# Patient Record
Sex: Female | Born: 1950 | Race: Asian | Hispanic: No | Smoking: Never smoker
Health system: Southern US, Community
[De-identification: ages and names within clinical notes are randomized; demographics above are authoritative.]

## PROBLEM LIST (undated history)

## (undated) DIAGNOSIS — I10 Essential (primary) hypertension: Secondary | ICD-10-CM

## (undated) DIAGNOSIS — M1990 Unspecified osteoarthritis, unspecified site: Secondary | ICD-10-CM

## (undated) HISTORY — DX: Essential (primary) hypertension: I10

---

## 2014-06-06 ENCOUNTER — Emergency Department (HOSPITAL_BASED_OUTPATIENT_CLINIC_OR_DEPARTMENT_OTHER)
Admission: EM | Admit: 2014-06-06 | Discharge: 2014-06-06 | Disposition: A | Discharge status: Home / Self Care | Payer: Self-pay | Attending: Emergency Medicine | Admitting: Emergency Medicine

## 2014-06-06 ENCOUNTER — Emergency Department (HOSPITAL_BASED_OUTPATIENT_CLINIC_OR_DEPARTMENT_OTHER): Payer: Self-pay

## 2014-06-06 ENCOUNTER — Encounter (HOSPITAL_BASED_OUTPATIENT_CLINIC_OR_DEPARTMENT_OTHER): Payer: Self-pay | Admitting: *Deleted

## 2014-06-06 DIAGNOSIS — R52 Pain, unspecified: Secondary | ICD-10-CM

## 2014-06-06 DIAGNOSIS — M25461 Effusion, right knee: Secondary | ICD-10-CM | POA: Insufficient documentation

## 2014-06-06 HISTORY — DX: Unspecified osteoarthritis, unspecified site: M19.90

## 2014-06-06 LAB — CBC WITH DIFFERENTIAL/PLATELET
BASOS ABS: 0 10*3/uL (ref 0.0–0.1)
Basophils Relative: 0 % (ref 0–1)
EOS PCT: 1 % (ref 0–5)
Eosinophils Absolute: 0.1 10*3/uL (ref 0.0–0.7)
HEMATOCRIT: 37.6 % (ref 36.0–46.0)
HEMOGLOBIN: 12.6 g/dL (ref 12.0–15.0)
LYMPHS PCT: 15 % (ref 12–46)
Lymphs Abs: 1.1 10*3/uL (ref 0.7–4.0)
MCH: 29.2 pg (ref 26.0–34.0)
MCHC: 33.5 g/dL (ref 30.0–36.0)
MCV: 87.2 fL (ref 78.0–100.0)
MONO ABS: 0.8 10*3/uL (ref 0.1–1.0)
MONOS PCT: 10 % (ref 3–12)
NEUTROS ABS: 5.7 10*3/uL (ref 1.7–7.7)
Neutrophils Relative %: 74 % (ref 43–77)
Platelets: 433 10*3/uL — ABNORMAL HIGH (ref 150–400)
RBC: 4.31 MIL/uL (ref 3.87–5.11)
RDW: 12.9 % (ref 11.5–15.5)
WBC: 7.7 10*3/uL (ref 4.0–10.5)

## 2014-06-06 LAB — BASIC METABOLIC PANEL
ANION GAP: 15 (ref 5–15)
BUN: 16 mg/dL (ref 6–23)
CALCIUM: 9.6 mg/dL (ref 8.4–10.5)
CHLORIDE: 100 meq/L (ref 96–112)
CO2: 25 meq/L (ref 19–32)
CREATININE: 0.6 mg/dL (ref 0.50–1.10)
GFR calc Af Amer: >90 mL/min (ref >90)
GFR calc non Af Amer: >90 mL/min (ref >90)
Glucose, Bld: 130 mg/dL — ABNORMAL HIGH (ref 70–99)
Potassium: 4.1 mEq/L (ref 3.7–5.3)
Sodium: 140 mEq/L (ref 137–147)

## 2014-06-06 MED ORDER — KETOROLAC TROMETHAMINE 60 MG/2ML IM SOLN
30.0000 mg | Freq: Once | INTRAMUSCULAR | Status: AC
  Administered 2014-06-06: 30 mg via INTRAMUSCULAR
  Filled 2014-06-06: qty 2

## 2014-06-06 MED ORDER — HYDROCODONE-ACETAMINOPHEN 5-325 MG PO TABS: 1.0000 | ORAL_TABLET | ORAL | Status: DC | PRN

## 2014-06-06 NOTE — ED Notes (Signed)
Pain in her right x 2 months. Pain in her left knee x 3 days. Daughter states she now needs assistance walking due to pain.

## 2014-06-06 NOTE — ED Provider Notes (Signed)
CSN: [id number]     Arrival date & time 06/06/14  1224 History   First MD Initiated Contact with Patient 06/06/14 1234     Chief Complaint  Patient presents with  . Knee Pain     (Consider location/radiation/quality/duration/timing/severity/associated sxs/prior Treatment) HPI Comments: Pt comes in today with right knee pain times 2 months. 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 Pt states that she has had some generalized muscle aches as well. No fever, redness or warmth to any joints. Pt states that she doesn't have any medical problems and she sees her pcp in [place] two times a year. She states that her left knee has been bothering her intermittently as well  The history is provided by the patient and a relative. No language interpreter was used.    Past Medical History  Diagnosis Date  . Arthritis    History reviewed. No pertinent past surgical history. No family history on file. History  Substance Use Topics  . Smoking status: Never Smoker   . Smokeless tobacco: Not on file  . Alcohol Use: No   OB History    No data available     Review of Systems  All other systems reviewed and are negative.     Allergies  Review of patient's allergies indicates no known allergies.  Home Medications   Prior to Admission medications   Not on File   BP 142/89 mmHg  Pulse 87  Temp(Src) 97.6 F (36.4 C) (Oral)  Resp 18  Ht 5\' 1"  (1.549 m)  Wt 125 lb (56.7 kg)  BMI 23.63 kg/m2  SpO2 97% Physical Exam  Constitutional: She is oriented to person, place, and time. She appears well-developed and well-nourished.  Cardiovascular: Normal rate and regular rhythm.   Pulmonary/Chest: Effort normal and breath sounds normal.  Musculoskeletal:  Mild swelling noted to the right knee. Pt has full rom. No redness or warmth noted to the area. Pt moving all extremities without any problem   Neurological: She is oriented to person, place, and time. She exhibits normal muscle tone. Coordination normal.  Skin: Skin is warm and dry.  Nursing note and vitals reviewed.   ED Course  Procedures (including critical care time) Labs Review Labs Reviewed  CBC WITH DIFFERENTIAL - Abnormal; Notable for the following:    Platelets 433 (*)    All other components within normal limits  BASIC METABOLIC PANEL - Abnormal; Notable for the following:    Glucose, Bld 130 (*)    All other components within normal limits    Imaging Review Dg Knee 1-2 Views Right  06/06/2014   CLINICAL DATA:  Atraumatic anterior right knee pain ; history of intermittent peripatellar soft tissue swelling.  EXAM: RIGHT KNEE - 1-2 VIEW  COMPARISON:  None.  FINDINGS: The bones of the right knee are adequately mineralized. There is no acute fracture nor dislocation. There is minimal beaking of the tibial spines. There is no lytic nor blastic bony lesion.A small suprapatellar a fusion is suspected.  IMPRESSION: There is no acute bony abnormality of the right knee. A small suprapatellar effusion is suspected.   Electronically Signed   By: David  13/11/2013   On: 06/06/2014 13:20     EKG Interpretation None      MDM   Final diagnoses:  Pain  Knee effusion, right    Discussed follow up with DR. 13/11/2013 and  will given hydrocodone for pain as pt is already taking ibuprofen without much relief    Teressa Lower, NP 06/06/14 1417  Purvis Sheffield, MD 06/06/14 1439

## 2014-06-06 NOTE — Discharge Instructions (Signed)
Knee Effusion ° Knee effusion means you have fluid in your knee. The knee may be more difficult to bend and move. °HOME CARE °· Use crutches or a brace as told by your doctor. °· Put ice on the injured area. °¨ Put ice in a plastic bag. °¨ Place a towel between your skin and the bag. °¨ Leave the ice on for 15-20 minutes, 03-04 times a day. °· Raise (elevate) your knee as much as possible. °· Only take medicine as told by your doctor. °· You may need to do strengthening exercises. Ask your doctor. °· Continue with your normal diet and activities as told by your doctor. °GET HELP RIGHT AWAY IF: °· You have more puffiness (swelling) in your knee. °· You see redness, puffiness, or have more pain in your knee. °· You have a temperature by mouth above 102° F (38.9° C). °· You get a rash. °· You have trouble breathing. °· You have a reaction to any medicine you are taking. °· You have a lot of pain when you move your knee. °MAKE SURE YOU: °· Understand these instructions. °· Will watch your condition. °· Will get help right away if you are not doing well or get worse. °Document Released: 08/21/2010 Document Revised: 10/11/2011 Document Reviewed: 08/21/2010 °ExitCare® Patient Information ©2015 [employer]. This information is not intended to replace advice given to you by your health care provider. Make sure you discuss any questions you have with your health care provider. ° °

## 2014-06-11 ENCOUNTER — Other Ambulatory Visit: Payer: Self-pay | Admitting: Family Medicine

## 2014-06-11 ENCOUNTER — Ambulatory Visit (INDEPENDENT_AMBULATORY_CARE_PROVIDER_SITE_OTHER): Payer: Self-pay | Admitting: Family Medicine

## 2014-06-11 ENCOUNTER — Encounter: Payer: Self-pay | Admitting: Family Medicine

## 2014-06-11 VITALS — BP 155/101 | HR 85 | Ht 63.0 in | Wt 120.0 lb

## 2014-06-11 DIAGNOSIS — M2550 Pain in unspecified joint: Secondary | ICD-10-CM

## 2014-06-11 DIAGNOSIS — M1990 Unspecified osteoarthritis, unspecified site: Secondary | ICD-10-CM

## 2014-06-11 DIAGNOSIS — M064 Inflammatory polyarthropathy: Secondary | ICD-10-CM

## 2014-06-11 MED ORDER — PREDNISONE (PAK) 10 MG PO TABS: ORAL_TABLET | ORAL | Status: DC

## 2014-06-11 NOTE — Patient Instructions (Signed)
Your history and exam are consistent with an inflammatory arthritis (rheumatoid arthritis is one example but there are many different ones). We aspirated your knee today and sent this to the lab. We will contact you within 2 days with your results. Take prednisone as directed for 12 days - finish this even if you are feeling better.

## 2014-06-12 DIAGNOSIS — M069 Rheumatoid arthritis, unspecified: Secondary | ICD-10-CM | POA: Insufficient documentation

## 2014-06-12 LAB — SYNOVIAL CELL COUNT + DIFF, W/ CRYSTALS
Crystals, Fluid: NONE SEEN
Eosinophils-Synovial: 0 % (ref 0–1)
LYMPHOCYTES-SYNOVIAL FLD: 32 % — AB (ref 0–20)
MONOCYTE/MACROPHAGE: 31 % — AB (ref 50–90)
Neutrophil, Synovial: 37 % — ABNORMAL HIGH (ref 0–25)
WBC, SYNOVIAL: 9375 uL — AB (ref 0–200)

## 2014-06-12 LAB — GLUCOSE, SYNOVIAL FLUID: Glucose, Synovial Fluid: 97 mg/dL

## 2014-06-12 LAB — PROTEIN, SYNOVIAL FLUID: Protein, Synovial Fluid: 4.4 g/dL — ABNORMAL HIGH (ref 1.0–3.0)

## 2014-06-12 NOTE — Assessment & Plan Note (Signed)
patient's history of multiple arthralgias without a precipitating event, associated effusions of knees with warmth all suggests an inflammatory arthropathy.  Aspirated left knee as greater effusion by ultrasound here - will send for analysis.  Also start prednisone dose pack x 12 days.  She does not have a PCP - encouraged her to get one.  Will need to see rheumatology if synovial analysis consistent with an inflammatory process.  After informed written consent patient was lying supine on exam table.  Left knee was prepped with iodine and alcohol swab.  Utilizing superolateral approach, 3 mL of marcaine was used for local anesthesia.  Then using an 18g needle on 60cc syringe, 20 mL of clear straw-colored fluid was aspirated from right knee.  Patient tolerated procedure well without immediate complications

## 2014-06-12 NOTE — Progress Notes (Addendum)
PCP: No primary care provider on file.  Subjective:   HPI: Patient is a 63 y.o. female here for multiple joint aches.  Patient here with son and interpreter. They report about 2 weeks ago she woke up with multiple arthralgias including pain at base of skull, in shoulders, both knees, elbows.  Associated swelling in knees. Was unable to get out of bed due to the pain. Nothing similar previously. Has not taken anything for this. Has not seen a rheumatologist. No rashes, fever.  Past Medical History  Diagnosis Date  . Arthritis     Current Outpatient Prescriptions on File Prior to Visit  Medication Sig Dispense Refill  . HYDROcodone-acetaminophen (NORCO/VICODIN) 5-325 MG per tablet Take 1 tablet by mouth every 4 (four) hours as needed. 15 tablet 0   No current facility-administered medications on file prior to visit.    No past surgical history on file.  No Known Allergies  History   Social History  . Marital Status: Single    Spouse Name: N/A    Number of Children: N/A  . Years of Education: N/A   Occupational History  . Not on file.   Social History Main Topics  . Smoking status: Never Smoker   . Smokeless tobacco: Not on file  . Alcohol Use: No  . Drug Use: No  . Sexual Activity: Not on file   Other Topics Concern  . Not on file   Social History Narrative    No family history on file.  BP 155/101 mmHg  Pulse 85  Ht 5\' 3"  (1.6 m)  Wt 120 lb (54.432 kg)  BMI 21.26 kg/m2  Review of Systems: See HPI above.    Objective:  Physical Exam:  Gen: NAD  Bilateral knees: No gross deformity, ecchymoses.  Mod effusion L > R knees.  No erythema but warmth of both knees. Mild diffuse TTP right knee.  No tenderness left knee. FROM. Negative ant/post drawers. Negative valgus/varus testing. Negative lachmanns. Negative mcmurrays, apleys, patellar apprehension. NV intact distally.    Assessment & Plan:  1. Arthralgias - patient's history of multiple  arthralgias without a precipitating event, associated effusions of knees with warmth all suggests an inflammatory arthropathy.  Aspirated left knee as greater effusion by ultrasound here - will send for analysis.  Also start prednisone dose pack x 12 days.  She does not have a PCP - encouraged her to get one.  Will need to see rheumatology if synovial analysis consistent with an inflammatory process.  After informed written consent patient was lying supine on exam table.  Left knee was prepped with iodine and alcohol swab.  Utilizing superolateral approach, 3 mL of marcaine was used for local anesthesia.  Then using an 18g needle on 60cc syringe, 20 mL of clear straw-colored fluid was aspirated from right knee.  Patient tolerated procedure well without immediate complications  Addendum:  Synovial fluid labs reviewed and discussed with patient's son.  Has an elevated WBC count at 9375 with elevated protein, normal glucose.  Culture negative.  Most consistent with an inflammatory arthropathy as expected.  Will refer to rheumatology for further evaluation.

## 2014-06-13 NOTE — Addendum Note (Signed)
Addended by: Kathi Simpers F on: 06/13/2014 10:12 AM   Modules accepted: Orders

## 2014-06-15 LAB — BODY FLUID CULTURE
Gram Stain: NONE SEEN
Organism ID, Bacteria: NO GROWTH

## 2014-06-25 ENCOUNTER — Telehealth: Payer: Self-pay | Admitting: Family Medicine

## 2014-06-26 NOTE — Telephone Encounter (Signed)
We cannot refill a 12 day prednisone dose pack.  Any updates on rheumatology referral?

## 2014-06-26 NOTE — Telephone Encounter (Signed)
Please notify patient we cannot refill the prednisone - if there's a particular joint that is bothering her would could possibly do a cortisone injection depending on the joint.  But the problem is she really needs to see rheumatology to get on the path to improvement.  I cannot put her on high dose prednisone for weeks to months - that's something that needs to be decided and monitored by rheumatology.

## 2014-06-26 NOTE — Telephone Encounter (Signed)
Spoke with Rheumatology office and was told that the physician has been out of the office since 06-18-14 and will return on 07-01-14. Was told to call back on Monday (11-30) afternoon.

## 2014-09-18 ENCOUNTER — Ambulatory Visit
Admission: RE | Admit: 2014-09-18 | Discharge: 2014-09-18 | Disposition: A | Discharge status: Home / Self Care | Payer: Medicare Other | Source: Ambulatory Visit | Attending: Rheumatology | Admitting: Rheumatology

## 2014-09-18 ENCOUNTER — Other Ambulatory Visit: Payer: Self-pay | Admitting: Rheumatology

## 2014-09-18 DIAGNOSIS — Z5189 Encounter for other specified aftercare: Secondary | ICD-10-CM

## 2014-09-24 ENCOUNTER — Ambulatory Visit (INDEPENDENT_AMBULATORY_CARE_PROVIDER_SITE_OTHER): Payer: Medicare Other | Admitting: Emergency Medicine

## 2014-09-24 VITALS — BP 140/86 | HR 65 | Temp 98.2°F | Resp 18 | Ht 61.0 in | Wt 124.0 lb

## 2014-09-24 DIAGNOSIS — R7989 Other specified abnormal findings of blood chemistry: Secondary | ICD-10-CM

## 2014-09-24 DIAGNOSIS — E119 Type 2 diabetes mellitus without complications: Secondary | ICD-10-CM

## 2014-09-24 DIAGNOSIS — E039 Hypothyroidism, unspecified: Secondary | ICD-10-CM

## 2014-09-24 LAB — COMPREHENSIVE METABOLIC PANEL
ALBUMIN: 4.3 g/dL (ref 3.5–5.2)
ALT: 22 U/L (ref 0–35)
AST: 14 U/L (ref 0–37)
Alkaline Phosphatase: 93 U/L (ref 39–117)
BUN: 22 mg/dL (ref 6–23)
CALCIUM: 9.8 mg/dL (ref 8.4–10.5)
CHLORIDE: 99 meq/L (ref 96–112)
CO2: 23 mEq/L (ref 19–32)
Creat: 0.73 mg/dL (ref 0.50–1.10)
Glucose, Bld: 101 mg/dL — ABNORMAL HIGH (ref 70–99)
POTASSIUM: 4.2 meq/L (ref 3.5–5.3)
SODIUM: 137 meq/L (ref 135–145)
Total Bilirubin: 0.4 mg/dL (ref 0.2–1.2)
Total Protein: 7.6 g/dL (ref 6.0–8.3)

## 2014-09-24 LAB — GLUCOSE, POCT (MANUAL RESULT ENTRY): POC Glucose: 94 mg/dl (ref 70–99)

## 2014-09-24 LAB — POCT UA - MICROSCOPIC ONLY
Bacteria, U Microscopic: NEGATIVE
CRYSTALS, UR, HPF, POC: NEGATIVE
Casts, Ur, LPF, POC: NEGATIVE
Mucus, UA: NEGATIVE
Yeast, UA: NEGATIVE

## 2014-09-24 LAB — POCT URINALYSIS DIPSTICK
Bilirubin, UA: NEGATIVE
Blood, UA: NEGATIVE
Glucose, UA: NEGATIVE
Ketones, UA: NEGATIVE
Leukocytes, UA: NEGATIVE
NITRITE UA: NEGATIVE
PROTEIN UA: NEGATIVE
Spec Grav, UA: 1.02
Urobilinogen, UA: 0.2
pH, UA: 5

## 2014-09-24 LAB — LIPID PANEL
CHOL/HDL RATIO: 4.7 ratio
CHOLESTEROL: 341 mg/dL — AB (ref 0–200)
HDL: 72 mg/dL (ref >=46)
Triglycerides: 408 mg/dL — ABNORMAL HIGH (ref <150)

## 2014-09-24 LAB — TSH: TSH: 19.749 u[IU]/mL — ABNORMAL HIGH (ref 0.350–4.500)

## 2014-09-24 LAB — POCT GLYCOSYLATED HEMOGLOBIN (HGB A1C): Hemoglobin A1C: 6.6

## 2014-09-24 LAB — T3, FREE: T3, Free: 2.4 pg/mL (ref 2.3–4.2)

## 2014-09-24 LAB — T4, FREE: Free T4: 1.27 ng/dL (ref 0.80–1.80)

## 2014-09-24 MED ORDER — LEVOTHYROXINE SODIUM 100 MCG PO TABS: 100.0000 ug | ORAL_TABLET | Freq: Every day | ORAL | Status: DC

## 2014-09-24 NOTE — Progress Notes (Signed)
[facility] [address] [place] [address] [phone]  Date:  09/24/2014   Name:  Brianna Stafford   DOB:  1951/02/09   MRN:  [id number]  PCP:  No primary care provider on file.    Chief Complaint: Hypertension and discuss abnormal labs   History of Present Illness:  Brianna Stafford is a 64 y.o. very pleasant female patient who presents with the following:  Sent by rheumatologist after starting her on prednisone and finding she had a markedly elevated BS (260's). Stopped prednisone yesterday and came at direction of her doc. She has no history of glucose intolerance No nausea or vomiting. No stool change No rash. Polyuria and frequency and thirst. No improvement with over the counter medications or other home remedies.  Denies other complaint or health concern today.   Patient Active Problem List   Diagnosis Date Noted  . Arthralgia of multiple joints 06/12/2014    Past Medical History  Diagnosis Date  . Arthritis   . Hypertension     History reviewed. No pertinent past surgical history.  History  Substance Use Topics  . Smoking status: Never Smoker   . Smokeless tobacco: Not on file  . Alcohol Use: No    Family History  Problem Relation Age of Onset  . Diabetes Mother   . Cancer Sister     No Known Allergies  Medication list has been reviewed and updated.  Current Outpatient Prescriptions on File Prior to Visit  Medication Sig Dispense Refill  . predniSONE (STERAPRED UNI-PAK) 10 MG tablet 6 tabs po days 1-2, 5 tabs po days 3-4, 4 tabs po days 5-6, 3 tabs po days 7-8, 2 tabs po days 9-10, 1 tab po days 11-12 42 tablet 0  . HYDROcodone-acetaminophen (NORCO/VICODIN) 5-325 MG per tablet Take 1 tablet by mouth every 4 (four) hours as needed. (Patient not taking: Reported on 09/24/2014) 15 tablet 0   No current facility-administered medications on file prior to visit.    Review of Systems:  As per HPI, otherwise negative.    Physical  Examination: Filed Vitals:   09/24/14 1304  BP: 140/86  Pulse: 65  Temp: 98.2 F (36.8 C)  Resp: 18   Filed Vitals:   09/24/14 1304  Height: 5\' 1"  (1.549 m)  Weight: 124 lb (56.246 kg)   Body mass index is 23.44 kg/(m^2). Ideal Body Weight: Weight in (lb) to have BMI = 25: 132  GEN: WDWN, NAD, Non-toxic, A & O x 3 HEENT: Atraumatic, Normocephalic. Neck supple. No masses, No LAD. Ears and Nose: No external deformity. CV: RRR, No M/G/R. No JVD. No thrill. No extra heart sounds. PULM: CTA B, no wheezes, crackles, rhonchi. No retractions. No resp. distress. No accessory muscle use. ABD: S, NT, ND, +BS. No rebound. No HSM. EXTR: No c/c/e NEURO Normal gait.  PSYCH: Normally interactive. Conversant. Not depressed or anxious appearing.  Calm demeanor.    Assessment and Plan: Hyperglycemia a Hypothyroidism Start synthroid Follow up in 104  Signed,  12-11-1988, MD   Results for orders placed or performed in visit on 09/24/14  POCT glucose (manual entry)  Result Value Ref Range   POC Glucose 94 70 - 99 mg/dl  POCT glycosylated hemoglobin (Hb A1C)  Result Value Ref Range   Hemoglobin A1C 6.6   POCT urinalysis dipstick  Result Value Ref Range   Color, UA yellow    Clarity, UA clear    Glucose, UA neg  Bilirubin, UA neg    Ketones, UA neg    Spec Grav, UA 1.020    Blood, UA neg    pH, UA 5.0    Protein, UA neg    Urobilinogen, UA 0.2    Nitrite, UA neg    Leukocytes, UA Negative   POCT UA - Microscopic Only  Result Value Ref Range   WBC, Ur, HPF, POC 0-1    RBC, urine, microscopic 0-1    Bacteria, U Microscopic neg    Mucus, UA neg    Epithelial cells, urine per micros 1-2    Crystals, Ur, HPF, POC neg    Casts, Ur, LPF, POC neg    Yeast, UA neg

## 2014-09-24 NOTE — Patient Instructions (Signed)
Hypothyroidism The thyroid is a large gland located in the lower front of your neck. The thyroid gland helps control metabolism. Metabolism is how your body handles food. It controls metabolism with the hormone thyroxine. When this gland is underactive (hypothyroid), it produces too little hormone.  CAUSES These include:   Absence or destruction of thyroid tissue.  Goiter due to iodine deficiency.  Goiter due to medications.  Congenital defects (since birth).  Problems with the pituitary. This causes a lack of TSH (thyroid stimulating hormone). This hormone tells the thyroid to turn out more hormone. SYMPTOMS  Lethargy (feeling as though you have no energy)  Cold intolerance  Weight gain (in spite of normal food intake)  Dry skin  Coarse hair  Menstrual irregularity (if severe, may lead to infertility)  Slowing of thought processes Cardiac problems are also caused by insufficient amounts of thyroid hormone. Hypothyroidism in the newborn is cretinism, and is an extreme form. It is important that this form be treated adequately and immediately or it will lead rapidly to retarded physical and mental development. DIAGNOSIS  To prove hypothyroidism, your caregiver may do blood tests and ultrasound tests. Sometimes the signs are hidden. It may be necessary for your caregiver to watch this illness with blood tests either before or after diagnosis and treatment. TREATMENT  Low levels of thyroid hormone are increased by using synthetic thyroid hormone. This is a safe, effective treatment. It usually takes about four weeks to gain the full effects of the medication. After you have the full effect of the medication, it will generally take another four weeks for problems to leave. Your caregiver may start you on low doses. If you have had heart problems the dose may be gradually increased. It is generally not an emergency to get rapidly to normal. HOME CARE INSTRUCTIONS   Take your  medications as your caregiver suggests. Let your caregiver know of any medications you are taking or start taking. Your caregiver will help you with dosage schedules.  As your condition improves, your dosage needs may increase. It will be necessary to have continuing blood tests as suggested by your caregiver.  Report all suspected medication side effects to your caregiver. SEEK MEDICAL CARE IF: Seek medical care if you develop:  Sweating.  Tremulousness (tremors).  Anxiety.  Rapid weight loss.  Heat intolerance.  Emotional swings.  Diarrhea.  Weakness. SEEK IMMEDIATE MEDICAL CARE IF:  You develop chest pain, an irregular heart beat (palpitations), or a rapid heart beat. MAKE SURE YOU:   Understand these instructions.  Will watch your condition.  Will get help right away if you are not doing well or get worse. Document Released: 07/19/2005 Document Revised: 10/11/2011 Document Reviewed: 03/08/2008 ExitCare Patient Information 2015 [employer]. This information is not intended to replace advice given to you by your health care provider. Make sure you discuss any questions you have with your health care provider.  

## 2014-09-25 ENCOUNTER — Other Ambulatory Visit: Payer: Self-pay | Admitting: Emergency Medicine

## 2014-09-25 ENCOUNTER — Encounter: Payer: Self-pay | Admitting: Family Medicine

## 2014-09-25 LAB — MICROALBUMIN, URINE: Microalb, Ur: 0.6 mg/dL (ref <2.0)

## 2014-09-25 MED ORDER — ATORVASTATIN CALCIUM 20 MG PO TABS: 20.0000 mg | ORAL_TABLET | Freq: Every day | ORAL | Status: DC

## 2015-01-10 NOTE — Addendum Note (Signed)
Addended by: Johnnette Litter on: 01/10/2015 02:59 PM   Modules accepted: Kipp Brood

## 2015-10-22 ENCOUNTER — Other Ambulatory Visit: Payer: Self-pay

## 2015-10-22 MED ORDER — ATORVASTATIN CALCIUM 20 MG PO TABS: 20.0000 mg | ORAL_TABLET | Freq: Every day | ORAL | Status: DC

## 2015-10-22 MED ORDER — LEVOTHYROXINE SODIUM 100 MCG PO TABS: 100.0000 ug | ORAL_TABLET | Freq: Every day | ORAL | Status: DC

## 2016-03-02 ENCOUNTER — Other Ambulatory Visit: Payer: Self-pay | Admitting: Physician Assistant

## 2016-03-16 ENCOUNTER — Encounter: Payer: Self-pay | Admitting: Physician Assistant

## 2016-07-20 DIAGNOSIS — R945 Abnormal results of liver function studies: Secondary | ICD-10-CM

## 2016-07-20 DIAGNOSIS — M19071 Primary osteoarthritis, right ankle and foot: Secondary | ICD-10-CM | POA: Insufficient documentation

## 2016-07-20 DIAGNOSIS — Z603 Acculturation difficulty: Secondary | ICD-10-CM | POA: Insufficient documentation

## 2016-07-20 DIAGNOSIS — M19042 Primary osteoarthritis, left hand: Secondary | ICD-10-CM

## 2016-07-20 DIAGNOSIS — M19041 Primary osteoarthritis, right hand: Secondary | ICD-10-CM | POA: Insufficient documentation

## 2016-07-20 DIAGNOSIS — Z79899 Other long term (current) drug therapy: Secondary | ICD-10-CM | POA: Insufficient documentation

## 2016-07-20 DIAGNOSIS — M19072 Primary osteoarthritis, left ankle and foot: Secondary | ICD-10-CM

## 2016-07-20 DIAGNOSIS — Z789 Other specified health status: Secondary | ICD-10-CM | POA: Insufficient documentation

## 2016-07-20 DIAGNOSIS — R7989 Other specified abnormal findings of blood chemistry: Secondary | ICD-10-CM | POA: Insufficient documentation

## 2016-07-20 NOTE — Progress Notes (Deleted)
   Office Visit Note  Patient: Brianna Stafford             Date of Birth: 1951-05-05           MRN: [id number]             PCP: No primary care provider on file. Referring: No ref. provider found Visit Date: 07/22/2016 Occupation: @GUAROCC @    Subjective:  No chief complaint on file.   History of Present Illness: Brianna Stafford is a 65 y.o. female ***   Activities of Daily Living:  Patient reports morning stiffness for *** {minute/hour:19697}.   Patient {ACTIONS;DENIES/REPORTS:21021675::"Denies"} nocturnal pain.  Difficulty dressing/grooming: {ACTIONS;DENIES/REPORTS:21021675::"Denies"} Difficulty climbing stairs: {ACTIONS;DENIES/REPORTS:21021675::"Denies"} Difficulty getting out of chair: {ACTIONS;DENIES/REPORTS:21021675::"Denies"} Difficulty using hands for taps, buttons, cutlery, and/or writing: {ACTIONS;DENIES/REPORTS:21021675::"Denies"}   No Rheumatology ROS completed.   PMFS History:  Patient Active Problem List   Diagnosis Date Noted  . Elevated LFTs 07/20/2016  . High risk medication use 07/20/2016  . Primary osteoarthritis of both hands 07/20/2016  . Primary osteoarthritis of both feet 07/20/2016  . Language barrier 07/20/2016  . Rheumatoid arthritis (HCC) 06/12/2014    Past Medical History:  Diagnosis Date  . Arthritis   . Hypertension     Family History  Problem Relation Age of Onset  . Diabetes Mother   . Cancer Sister    No past surgical history on file. Social History   Social History Narrative  . No narrative on file     Objective: Vital Signs: There were no vitals taken for this visit.   Physical Exam   Musculoskeletal Exam: ***  CDAI Exam: No CDAI exam completed.    Investigation: Findings:   February 2016, synovial fluid showed WBC count of 12,775, crystals in culture was negative, uric acid was 6.3, CK was normal, comprehensive metabolic panel showed glucose of 236.  CBC was normal, sed rate 66, rheumatoid factor 52, C-reactive protein 154.8,  ANA was negative.  TSH was elevated at 10.1.  Hep panel, HIV, immunoglobulins, UA, SPEP and IFE were negative.  The TB Gold was indeterminant.  Chest x-ray was normal.   10/21/2014 negative TB gold   01/27/2016 normal CBC and normal CMP     Imaging: No results found.  Speciality Comments: No specialty comments available.    Procedures:  No procedures performed Allergies: Patient has no known allergies.   Assessment / Plan:     Visit Diagnoses: Rheumatoid arthritis involving multiple sites with positive rheumatoid factor (HCC) - +RF +CCP elevated sed rate   Elevated LFTs  High risk medication use - methotrexate   Primary osteoarthritis of both hands  Primary osteoarthritis of both feet  Language barrier    Orders: No orders of the defined types were placed in this encounter.  No orders of the defined types were placed in this encounter.   Face-to-face time spent with patient was *** minutes. 50% of time was spent in counseling and coordination of care.  Follow-Up Instructions: No Follow-up on file.   Amy Littrell, RT

## 2016-07-22 ENCOUNTER — Ambulatory Visit: Payer: Medicare Other | Admitting: Rheumatology

## 2016-07-22 IMAGING — CR DG KNEE 1-2V*R*
2 series · 2 of 2 positions shown · non-contrast
Comparison: None.

CLINICAL DATA: Atraumatic anterior right knee pain ; history of
intermittent peripatellar soft tissue swelling.

EXAM:
RIGHT KNEE - 1-2 VIEW

[t knee ap right]
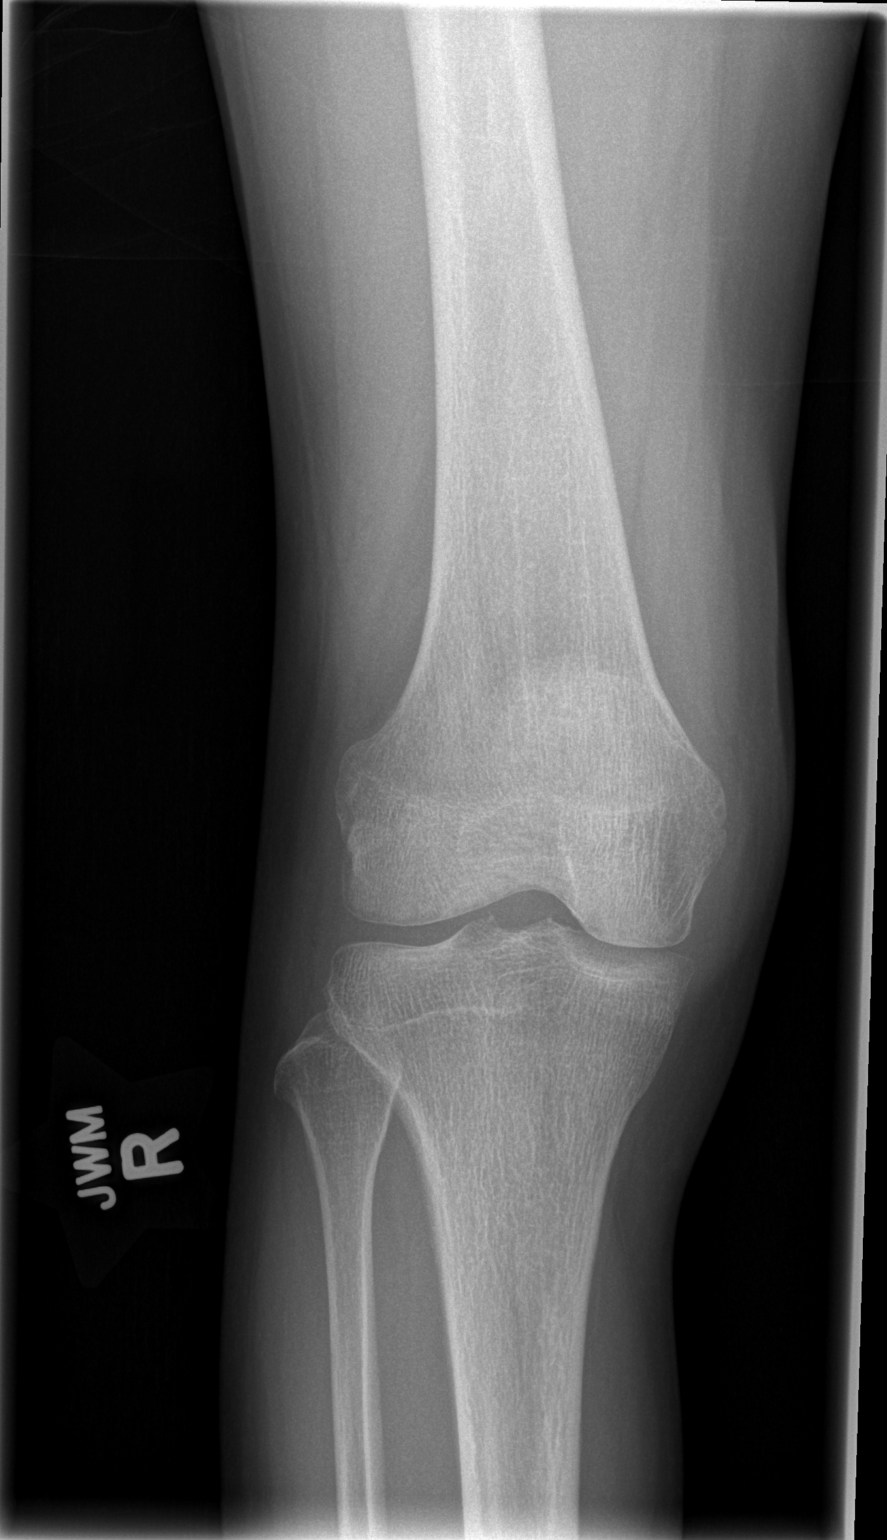

[t knee lat right]
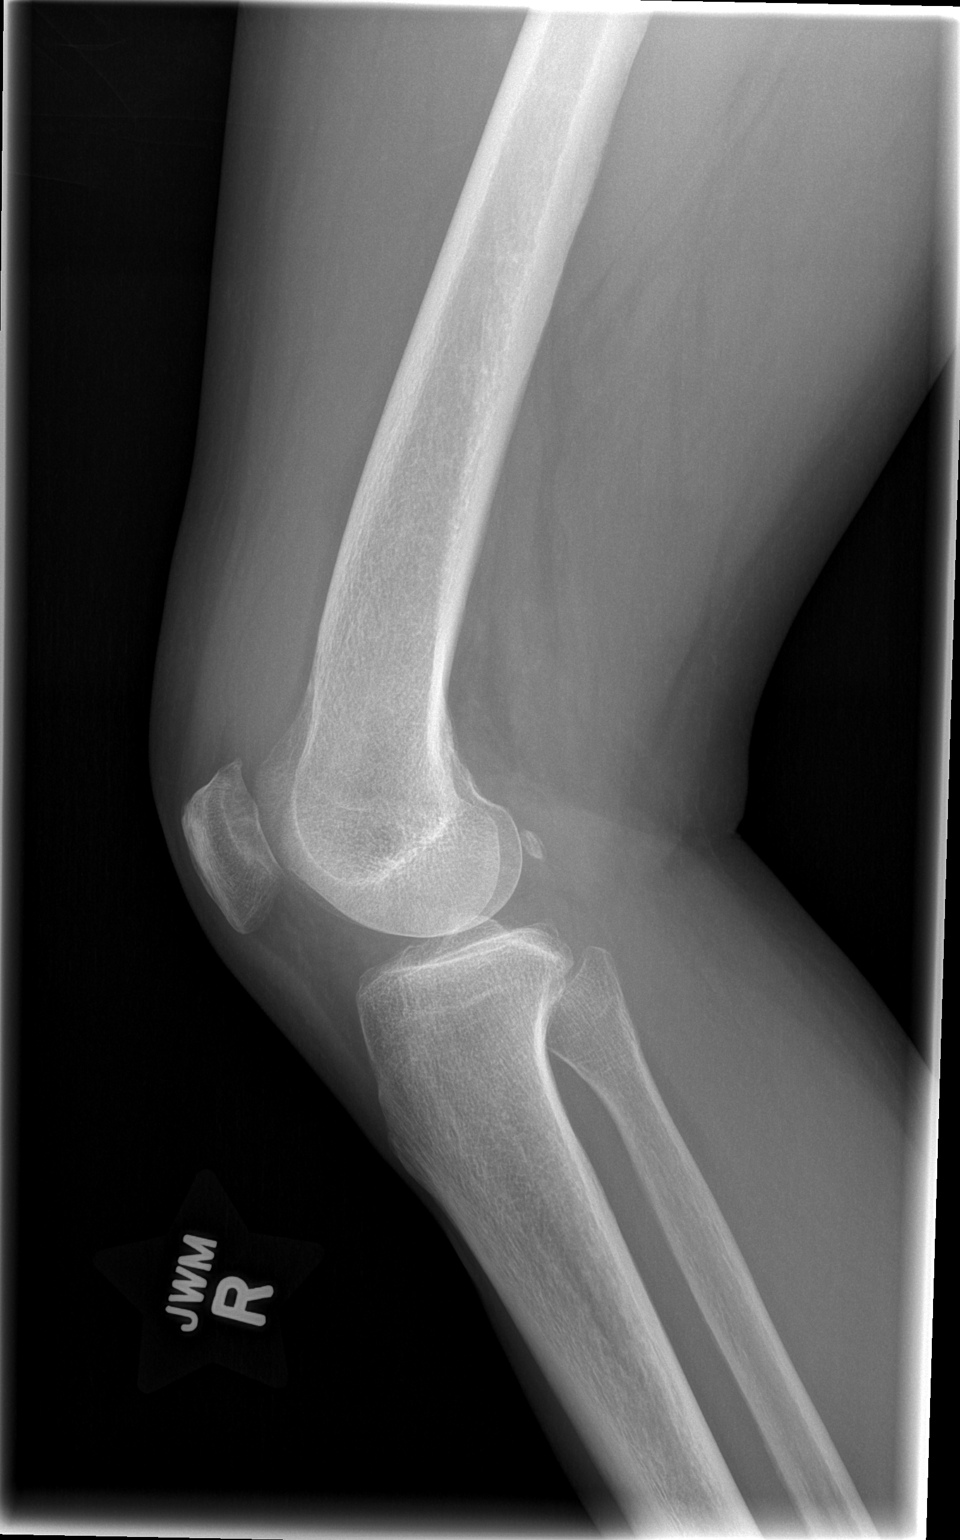

[2 of 2 positions shown; findings below may reference images not displayed]

FINDINGS: The bones of the right knee are adequately mineralized. There is no
acute fracture nor dislocation. There is minimal beaking of the
tibial spines. There is no lytic nor blastic bony lesion.A small
suprapatellar a fusion is suspected.
IMPRESSION: There is no acute bony abnormality of the right knee. A small
suprapatellar effusion is suspected.

## 2016-08-11 ENCOUNTER — Other Ambulatory Visit: Payer: Self-pay | Admitting: Rheumatology

## 2016-08-11 NOTE — Telephone Encounter (Signed)
Last Visit: 01/27/16 Next Visit:08/24/16 Labs: 01/28/16  Okay to refill Folic Acid?

## 2016-08-11 NOTE — Telephone Encounter (Signed)
ok 

## 2016-08-23 DIAGNOSIS — M170 Bilateral primary osteoarthritis of knee: Secondary | ICD-10-CM | POA: Insufficient documentation

## 2016-08-23 DIAGNOSIS — I10 Essential (primary) hypertension: Secondary | ICD-10-CM | POA: Insufficient documentation

## 2016-08-23 DIAGNOSIS — E039 Hypothyroidism, unspecified: Secondary | ICD-10-CM | POA: Insufficient documentation

## 2016-08-23 NOTE — Progress Notes (Signed)
Office Visit Note  Patient: Brianna Stafford             Date of Birth: 12-18-1950           MRN: [id number]             PCP: No PCP Per Patient Referring: No ref. provider found Visit Date: 08/24/2016 Occupation: _0 @    Subjective: Pain hands   History of Present Illness: Brianna Stafford is a 66 y.o. female with history of rheumatoid arthritis. She states she had been doing well until she went to [place] and skipped her medication for the month of November and December. Now she is having increased pain and swelling in her hands especially her right hand. No other joints are painful.  Activities of Daily Living:  Patient reports morning stiffness for all day hours.   Patient Denies nocturnal pain.  Difficulty dressing/grooming: Denies Difficulty climbing stairs: Denies Difficulty getting out of chair: Denies Difficulty using hands for taps, buttons, cutlery, and/or writing: Reports   Review of Systems  Constitutional: Negative for fatigue, night sweats, weight gain, weight loss and weakness.  HENT: Positive for mouth dryness. Negative for mouth sores, trouble swallowing, trouble swallowing and nose dryness.   Eyes: Negative for pain, redness, visual disturbance and dryness.  Respiratory: Negative for cough, shortness of breath and difficulty breathing.   Cardiovascular: Negative for chest pain, palpitations, hypertension, irregular heartbeat and swelling in legs/feet.  Gastrointestinal: Negative for blood in stool, constipation and diarrhea.  Endocrine: Negative for increased urination.  Genitourinary: Negative for vaginal dryness.  Musculoskeletal: Positive for arthralgias, joint pain, joint swelling and morning stiffness. Negative for myalgias, muscle weakness, muscle tenderness and myalgias.  Skin: Negative for color change, rash, hair loss, skin tightness, ulcers and sensitivity to sunlight.  Allergic/Immunologic: Negative for susceptible to infections.  Neurological: Negative for  dizziness, memory loss and night sweats.  Hematological: Negative for swollen glands.  Psychiatric/Behavioral: Negative for depressed mood and sleep disturbance. The patient is not nervous/anxious.     PMFS History:  Patient Active Problem List   Diagnosis Date Noted  . Primary osteoarthritis of both knees 08/23/2016  . Essential hypertension 08/23/2016  . Hypothyroidism 08/23/2016  . Elevated LFTs 07/20/2016  . High risk medication use 07/20/2016  . Primary osteoarthritis of both hands 07/20/2016  . Primary osteoarthritis of both feet 07/20/2016  . Language barrier 07/20/2016  . Rheumatoid arthritis ([place]) 06/12/2014    Past Medical History:  Diagnosis Date  . Arthritis   . Hypertension     Family History  Problem Relation Age of Onset  . Diabetes Mother   . Cancer Sister    No past surgical history on file. Social History   Social History Narrative  . No narrative on file     Objective: Vital Signs: BP 130/80   Pulse 70   Resp 14   Ht 5' 3" (1.6 m)   Wt 133 lb (60.3 kg)   BMI 23.56 kg/m    Physical Exam  Constitutional: She is oriented to person, place, and time. She appears well-developed and well-nourished.  HENT:  Head: Normocephalic and atraumatic.  Eyes: Conjunctivae and EOM are normal.  Neck: Normal range of motion.  Cardiovascular: Normal rate, regular rhythm, normal heart sounds and intact distal pulses.   Pulmonary/Chest: Effort normal and breath sounds normal.  Abdominal: Soft. Bowel sounds are normal.  Lymphadenopathy:    She has no cervical adenopathy.  Neurological: She is alert and oriented to person,  place, and time.  Skin: Skin is warm and dry. Capillary refill takes less than 2 seconds.  Psychiatric: She has a normal mood and affect. Her behavior is normal.  Nursing note and vitals reviewed.    Musculoskeletal Exam: C-spine and thoracic lumbar spine good range of motion no SI joint tenderness. She is painful range of motion of right  shoulder joint with warmth and swelling in her right shoulder joint and a small effusion. Left shoulder joint is full range of motion with no swelling. Bilateral elbow joints wrist joints MCPs PIPs DIPs with good range of motion she has synovitis over her right second MCP joint and right second and third PIP joints. Although joints full range of motion with no synovitis.  CDAI Exam: CDAI Homunculus Exam:   Tenderness:  RUE: glenohumeral Right hand: 2nd MCP, 2nd PIP and 3rd PIP  Swelling:  RUE: glenohumeral Right hand: 2nd MCP and 3rd PIP  Joint Counts:  CDAI Tender Joint count: 4 CDAI Swollen Joint count: 3  Global Assessments:  Patient Global Assessment: 6 Provider Global Assessment: 6  CDAI Calculated Score: 19    Investigation: Findings:  March 2016 JB: Negative, February 2016 SPEP normal, immunoglobulins normal, HIV negative, IFE normal, hepatitis panel negative, chest x-ray normal 08/06/2015 CBC normal CMP ALT 47  01/27/2016 CBC normal, CMP normal    Imaging: Xr Hand 2 View Left  Result Date: 08/24/2016 No MCP joint narrowing minimal PIP/DIP joint narrowing no intercarpal joint space narrowing no erosive changes.  Xr Hand 2 View Right  Result Date: 08/24/2016 Right first second and third MCP joint minimal narrowing check starting her osteopenia, PIP/DIP narrowing, no intercarpal joint space narrowing, no erosive changes. Impression: These findings are consistent with rheumatoid arthritis.  Xr Shoulder Right  Result Date: 08/24/2016 No glenohumeral joint space narrowing no before meals joint space narrowing no chondrocalcinosis. Impression: Normal x-ray of the shoulder joint.   Speciality Comments: No specialty comments available.    Procedures:  Large Joint Inj Date/Time: 08/24/2016 5:06 PM Performed by: Bo Merino Authorized by: Bo Merino   Consent Given by:  Patient Site marked: the procedure site was marked   Timeout: prior to procedure  the correct patient, procedure, and site was verified   Indications:  Pain Location:  Shoulder Site:  R glenohumeral Prep: patient was prepped and draped in usual sterile fashion   Needle Size:  27 G Needle Length:  1.5 inches Approach:  Anterior Ultrasound Guidance: No   Fluoroscopic Guidance: No   Arthrogram: No   Medications:  1 mL lidocaine 1 %; 40 mg triamcinolone acetonide 40 MG/ML Aspiration Attempted: Yes   Aspirate amount (mL):  0 Patient tolerance:  Patient tolerated the procedure well with no immediate complications   Allergies: Patient has no known allergies.   Assessment / Plan:     Visit Diagnoses: Rheumatoid arthritis involving multiple sites with positive rheumatoid factor (HCC) - Positive RF, positive anti-CCP, elevated ESR. She's having a flare with the pain and swelling in her right second and third MCP joint and right third PIP joint. She also has warmth swelling or effusion in her right shoulder. Her symptoms flared bowel she just discontinued methotrexate him as she was doing better. Prior to that she was on methotrexate 4 tablets by mouth every week.  High risk medication use -noncompliance with labs and medications. We will check labs today and then every 3 months to monitor for drug toxicity. Plan: CBC with Differential/Platelet, COMPLETE METABOLIC PANEL WITH GFR,  CBC with Differential/Platelet, COMPLETE METABOLIC PANEL WITH GFR  Acute pain of right shoulder - With effusion - Plan: XR Shoulder Right. X-ray was unremarkable. After informed consent was obtained the right shoulder joints was prepped in sterile fashion but no fluid could be aspirated shoulder joint was injected with cortisone as described above. She told the procedure well.  Pain in both hands - Plan: XR Hand 2 View Right, XR Hand 2 View Left. X-ray findings are consistent with rheumatoid arthritis but she did not have any erosive changes.  Primary osteoarthritis of both knees - Moderate  osteoarthritis and chondromalacia patella  Primary osteoarthritis of both hands. Chronic changes  Primary osteoarthritis of both feet - Bilateral calcaneal spurs  Essential hypertension  Hypothyroidism - Plan: TSH, CANCELED: TSH  Language barrier    Orders: Orders Placed This Encounter  Procedures  . Large Joint Injection/Arthrocentesis  . XR Shoulder Right  . XR Hand 2 View Right  . XR Hand 2 View Left  . CBC with Differential/Platelet  . COMPLETE METABOLIC PANEL WITH GFR  . CBC with Differential/Platelet  . COMPLETE METABOLIC PANEL WITH GFR  . TSH   Meds ordered this encounter  Medications  . predniSONE (DELTASONE) 5 MG tablet    Sig: 4 tab q am x4days, 3 tab q am x4d, 2 tab q amx4 days, 1 tab q am x4d ,1/2 tab qam x4d then  d/c    Dispense:  42 tablet    Refill:  0  . folic acid (FOLVITE) 1 MG tablet    Sig: Take 1 tablet (1 mg total) by mouth daily.    Dispense:  90 tablet    Refill:  3    Face-to-face time spent with patient was 30 minutes. 50% of time was spent in counseling and coordination of care.  Follow-Up Instructions: Return in about 3 months (around 11/22/2016) for Rheumatoid arthritis.   Bo Merino, MD  Note - This record has been created using Editor, commissioning.  Chart creation errors have been sought, but may not always  have been located. Such creation errors do not reflect on  the standard of medical care.

## 2016-08-24 ENCOUNTER — Ambulatory Visit (INDEPENDENT_AMBULATORY_CARE_PROVIDER_SITE_OTHER): Payer: Medicare Other

## 2016-08-24 ENCOUNTER — Ambulatory Visit (INDEPENDENT_AMBULATORY_CARE_PROVIDER_SITE_OTHER): Payer: Medicare Other | Admitting: Rheumatology

## 2016-08-24 ENCOUNTER — Encounter: Payer: Self-pay | Admitting: Rheumatology

## 2016-08-24 ENCOUNTER — Ambulatory Visit (INDEPENDENT_AMBULATORY_CARE_PROVIDER_SITE_OTHER): Payer: Self-pay

## 2016-08-24 VITALS — BP 130/80 | HR 70 | Resp 14 | Ht 63.0 in | Wt 133.0 lb

## 2016-08-24 DIAGNOSIS — M79642 Pain in left hand: Secondary | ICD-10-CM

## 2016-08-24 DIAGNOSIS — M19071 Primary osteoarthritis, right ankle and foot: Secondary | ICD-10-CM

## 2016-08-24 DIAGNOSIS — M19042 Primary osteoarthritis, left hand: Secondary | ICD-10-CM

## 2016-08-24 DIAGNOSIS — I10 Essential (primary) hypertension: Secondary | ICD-10-CM

## 2016-08-24 DIAGNOSIS — Z789 Other specified health status: Secondary | ICD-10-CM

## 2016-08-24 DIAGNOSIS — M0579 Rheumatoid arthritis with rheumatoid factor of multiple sites without organ or systems involvement: Secondary | ICD-10-CM | POA: Diagnosis not present

## 2016-08-24 DIAGNOSIS — M79641 Pain in right hand: Secondary | ICD-10-CM

## 2016-08-24 DIAGNOSIS — M19041 Primary osteoarthritis, right hand: Secondary | ICD-10-CM | POA: Diagnosis not present

## 2016-08-24 DIAGNOSIS — Z79899 Other long term (current) drug therapy: Secondary | ICD-10-CM

## 2016-08-24 DIAGNOSIS — M25511 Pain in right shoulder: Secondary | ICD-10-CM | POA: Diagnosis not present

## 2016-08-24 DIAGNOSIS — M170 Bilateral primary osteoarthritis of knee: Secondary | ICD-10-CM | POA: Diagnosis not present

## 2016-08-24 DIAGNOSIS — M19072 Primary osteoarthritis, left ankle and foot: Secondary | ICD-10-CM | POA: Diagnosis not present

## 2016-08-24 DIAGNOSIS — E039 Hypothyroidism, unspecified: Secondary | ICD-10-CM

## 2016-08-24 LAB — CBC WITH DIFFERENTIAL/PLATELET
Basophils Absolute: 0 cells/uL (ref 0–200)
Basophils Relative: 0 %
Eosinophils Absolute: 148 cells/uL (ref 15–500)
Eosinophils Relative: 2 %
HEMATOCRIT: 41.5 % (ref 35.0–45.0)
Hemoglobin: 13.8 g/dL (ref 11.7–15.5)
LYMPHS PCT: 22 %
Lymphs Abs: 1628 cells/uL (ref 850–3900)
MCH: 29.9 pg (ref 27.0–33.0)
MCHC: 33.3 g/dL (ref 32.0–36.0)
MCV: 89.8 fL (ref 80.0–100.0)
MONO ABS: 518 {cells}/uL (ref 200–950)
MONOS PCT: 7 %
MPV: 9.8 fL (ref 7.5–12.5)
NEUTROS PCT: 69 %
Neutro Abs: 5106 cells/uL (ref 1500–7800)
Platelets: 332 10*3/uL (ref 140–400)
RBC: 4.62 MIL/uL (ref 3.80–5.10)
RDW: 13.8 % (ref 11.0–15.0)
WBC: 7.4 10*3/uL (ref 3.8–10.8)

## 2016-08-24 LAB — TSH: TSH: 19.76 mIU/L — ABNORMAL HIGH

## 2016-08-24 LAB — COMPLETE METABOLIC PANEL WITH GFR
ALT: 20 U/L (ref 6–29)
AST: 22 U/L (ref 10–35)
Albumin: 4.1 g/dL (ref 3.6–5.1)
Alkaline Phosphatase: 91 U/L (ref 33–130)
BUN: 23 mg/dL (ref 7–25)
CALCIUM: 9.6 mg/dL (ref 8.6–10.4)
CHLORIDE: 100 mmol/L (ref 98–110)
CO2: 27 mmol/L (ref 20–31)
Creat: 0.94 mg/dL (ref 0.50–0.99)
GFR, Est African American: 73 mL/min (ref >=60)
GFR, Est Non African American: 63 mL/min (ref >=60)
Glucose, Bld: 191 mg/dL — ABNORMAL HIGH (ref 65–99)
Potassium: 4.1 mmol/L (ref 3.5–5.3)
Sodium: 136 mmol/L (ref 135–146)
Total Bilirubin: 0.3 mg/dL (ref 0.2–1.2)
Total Protein: 7.3 g/dL (ref 6.1–8.1)

## 2016-08-24 MED ORDER — TRIAMCINOLONE ACETONIDE 40 MG/ML IJ SUSP
40.0000 mg | INTRAMUSCULAR | Status: AC | PRN
  Administered 2016-08-24: 40 mg via INTRA_ARTICULAR

## 2016-08-24 MED ORDER — LIDOCAINE HCL 1 % IJ SOLN
1.0000 mL | INTRAMUSCULAR | Status: AC | PRN
  Administered 2016-08-24: 1 mL

## 2016-08-24 MED ORDER — PREDNISONE 5 MG PO TABS: ORAL_TABLET | ORAL | 0 refills | Status: DC

## 2016-08-24 MED ORDER — FOLIC ACID 1 MG PO TABS: 1.0000 mg | ORAL_TABLET | Freq: Every day | ORAL | 3 refills | Status: DC

## 2016-08-24 NOTE — Progress Notes (Signed)
Rheumatology Medication Review by a Pharmacist Does the patient feel that his/her medications are working for him/her?  No - patient reports she stopped taking methotrexate for several months (November and December?).  She just recently refilled methotrexate and restarted the medication a few weeks ago.   Has the patient been experiencing any side effects to the medications prescribed?  No Does the patient have any problems obtaining medications?  No  Issues to address at subsequent visits: Adherence   Pharmacist comments:  Mrs. Doepke is a pleasant 66 yo F who presents for follow up of her rheumatoid arthritis.  She is currently taking methotrexate 4 tablets weekly, but has a history of non-adherence to her medication and laboratory monitoring schedule.  Patient reports she recently filled her methotrexate prescription.  Noted that methotrexate has not been refilled since 04/26/16.  I called [facility] and spoke to [place].  He confirms patient did pick up a 90-day supply of methotrexate on 08/11/16.  The prescription was written on 04/26/16.  Renae Fickle reports before this refill, patient had not filled methotrexate since 02/03/16.  Patient's most recent standing labs were on 01/27/16 at which time CBC was normal and CMP was normal except glucose of 115.  Patient is due for standing labs today.  Dr. Corliss Skains briefly discussed adding hydroxychloroquine (Plaquenil) today.  Patient reports she was previously doing well on methotrexate 4 tablets weekly before she stopped the medication and she wants to continue methotrexate monotherapy at this time.  I provided patient with information on hydroxychloroquine to review in case additional therapy is needed in the future.  Discussed importance of adherence with laboratory monitoring and follow up visits in the future in order for [place] to be able to refill her medication.  Patient voiced understanding.    Lilla Shook, Pharm.D., BCPS, CPP Clinical Pharmacist Pager:  [phone] Phone: [phone] 08/24/2016 4:34 PM

## 2016-08-24 NOTE — Progress Notes (Signed)
TSH high, glucose high. Patient was started on prednisone yesterday due to RA flare. She should contact her PCP as soon as possible to start on medications for hypothyroidism and control of her blood glucose levels. Please fax results to her PCP and notify patient.

## 2016-08-25 NOTE — Progress Notes (Signed)
She should go to urgent care. She can not wait.

## 2016-11-04 ENCOUNTER — Other Ambulatory Visit: Payer: Self-pay | Admitting: Rheumatology

## 2016-11-12 ENCOUNTER — Other Ambulatory Visit: Payer: Self-pay | Admitting: Rheumatology

## 2016-11-12 NOTE — Telephone Encounter (Signed)
ok 

## 2016-11-12 NOTE — Telephone Encounter (Signed)
Last Visit: 08/24/16 Next Visit: 11/26/16 Labs: 08/24/16 TSH and glucose elevated  Okay to refill MTx?

## 2016-11-15 NOTE — Progress Notes (Deleted)
Office Visit Note  Patient: Brianna Stafford             Date of Birth: [birth year]           MRN: [id number]             PCP: No PCP Per Patient Referring: No ref. provider found Visit Date: 11/26/2016 Occupation: @GUAROCC @    Subjective:  No chief complaint on file.   History of Present Illness: Brianna Stafford is a 66 y.o. female ***   Activities of Daily Living:  Patient reports morning stiffness for *** {minute/hour:19697}.   Patient {ACTIONS;DENIES/REPORTS:21021675::"Denies"} nocturnal pain.  Difficulty dressing/grooming: {ACTIONS;DENIES/REPORTS:21021675::"Denies"} Difficulty climbing stairs: {ACTIONS;DENIES/REPORTS:21021675::"Denies"} Difficulty getting out of chair: {ACTIONS;DENIES/REPORTS:21021675::"Denies"} Difficulty using hands for taps, buttons, cutlery, and/or writing: {ACTIONS;DENIES/REPORTS:21021675::"Denies"}   No Rheumatology ROS completed.   PMFS History:  Patient Active Problem List   Diagnosis Date Noted  . Dyslipidemia 11/23/2016  . Primary osteoarthritis of both knees 08/23/2016  . Essential hypertension 08/23/2016  . Hypothyroidism 08/23/2016  . Elevated LFTs 07/20/2016  . High risk medication use 07/20/2016  . Primary osteoarthritis of both hands 07/20/2016  . Primary osteoarthritis of both feet 07/20/2016  . Language barrier 07/20/2016  . Rheumatoid arthritis ([place]) 06/12/2014    Past Medical History:  Diagnosis Date  . Arthritis   . Hypertension     Family History  Problem Relation Age of Onset  . Diabetes Mother   . Cancer Sister    No past surgical history on file. Social History   Social History Narrative  . No narrative on file     Objective: Vital Signs: There were no vitals taken for this visit.   Physical Exam   Musculoskeletal Exam: ***  CDAI Exam: No CDAI exam completed.    Investigation: No additional findings. No visits with results within 3 Month(s) from this visit.  Latest known visit with results is:  Office Visit  on 08/24/2016  Component Date Value Ref Range Status  . WBC 08/24/2016 7.4  3.8 - 10.8 K/uL Final  . RBC 08/24/2016 4.62  3.80 - 5.10 MIL/uL Final  . Hemoglobin 08/24/2016 13.8  11.7 - 15.5 g/dL Final  . HCT 08/24/2016 41.5  35.0 - 45.0 % Final  . MCV 08/24/2016 89.8  80.0 - 100.0 fL Final  . MCH 08/24/2016 29.9  27.0 - 33.0 pg Final  . MCHC 08/24/2016 33.3  32.0 - 36.0 g/dL Final  . RDW 08/24/2016 13.8  11.0 - 15.0 % Final  . Platelets 08/24/2016 332  140 - 400 K/uL Final  . MPV 08/24/2016 9.8  7.5 - 12.5 fL Final  . Neutro Abs 08/24/2016 5106  1,500 - 7,800 cells/uL Final  . Lymphs Abs 08/24/2016 1628  850 - 3,900 cells/uL Final  . Monocytes Absolute 08/24/2016 518  200 - 950 cells/uL Final  . Eosinophils Absolute 08/24/2016 148  15 - 500 cells/uL Final  . Basophils Absolute 08/24/2016 0  0 - 200 cells/uL Final  . Neutrophils Relative % 08/24/2016 69  % Final  . Lymphocytes Relative 08/24/2016 22  % Final  . Monocytes Relative 08/24/2016 7  % Final  . Eosinophils Relative 08/24/2016 2  % Final  . Basophils Relative 08/24/2016 0  % Final  . Smear Review 08/24/2016 Criteria for review not met   Final  . Sodium 08/24/2016 136  135 - 146 mmol/L Final  . Potassium 08/24/2016 4.1  3.5 - 5.3 mmol/L Final  . Chloride 08/24/2016 100  98 - 110  mmol/L Final  . CO2 08/24/2016 27  20 - 31 mmol/L Final  . Glucose, Bld 08/24/2016 191* 65 - 99 mg/dL Final  . BUN 08/24/2016 23  7 - 25 mg/dL Final  . Creat 08/24/2016 0.94  0.50 - 0.99 mg/dL Final   Comment:   For patients > or = 67 years of age: The upper reference limit for Creatinine is approximately 13% higher for people identified as African-American.     . Total Bilirubin 08/24/2016 0.3  0.2 - 1.2 mg/dL Final  . Alkaline Phosphatase 08/24/2016 91  33 - 130 U/L Final  . AST 08/24/2016 22  10 - 35 U/L Final  . ALT 08/24/2016 20  6 - 29 U/L Final  . Total Protein 08/24/2016 7.3  6.1 - 8.1 g/dL Final  . Albumin 08/24/2016 4.1  3.6 - 5.1  g/dL Final  . Calcium 08/24/2016 9.6  8.6 - 10.4 mg/dL Final  . GFR, Est African American 08/24/2016 73  >=60 mL/min Final  . GFR, Est Non African American 08/24/2016 63  >=60 mL/min Final  . TSH 08/24/2016 19.76* mIU/L Final   Comment:   Reference Range   > or = 20 Years  0.40-4.50   Pregnancy Range First trimester  0.26-2.66 Second trimester 0.55-2.73 Third trimester  0.43-2.91          Imaging: No results found.  Speciality Comments: No specialty comments available.    Procedures:  No procedures performed Allergies: Patient has no known allergies.   Assessment / Plan:     Visit Diagnoses: Rheumatoid arthritis involving multiple sites with positive rheumatoid factor (HCC)  High risk medication use - Methotrexate, folic acid, prednisone  Elevated LFTs  Primary osteoarthritis of both knees  Primary osteoarthritis of both hands  Primary osteoarthritis of both feet  Language barrier  History of hypertension  History of hypothyroidism  Dyslipidemia    Orders: No orders of the defined types were placed in this encounter.  No orders of the defined types were placed in this encounter.   Face-to-face time spent with patient was *** minutes. 50% of time was spent in counseling and coordination of care.  Follow-Up Instructions: No Follow-up on file.   Bo Merino, MD  Note - This record has been created using Editor, commissioning.  Chart creation errors have been sought, but may not always  have been located. Such creation errors do not reflect on  the standard of medical care.

## 2016-11-23 DIAGNOSIS — E785 Hyperlipidemia, unspecified: Secondary | ICD-10-CM | POA: Insufficient documentation

## 2016-11-26 ENCOUNTER — Ambulatory Visit: Payer: Medicare Other | Admitting: *Deleted

## 2017-02-16 ENCOUNTER — Other Ambulatory Visit: Payer: Self-pay | Admitting: Rheumatology

## 2017-02-16 NOTE — Telephone Encounter (Signed)
Last Visit: 08/24/16 Next Visit in April 2018. Message sent to front to schedule patient. Labs: 08/24/16 TSH and glucose elevated  Left message to advise patient she is due to update labs.  Okay to refill 330 day supply MTX?

## 2017-02-16 NOTE — Telephone Encounter (Signed)
Ok to give 30-day supply.

## 2017-02-27 ENCOUNTER — Other Ambulatory Visit: Payer: Self-pay | Admitting: Rheumatology

## 2017-02-28 NOTE — Telephone Encounter (Signed)
No. She need labs. Sch appt

## 2017-02-28 NOTE — Telephone Encounter (Signed)
Last Visit: 08/24/16 Next Visit in April 2018. Message sent to front to schedule patient. Labs: 08/24/16 TSH and glucose elevated  Left message to advise patient she is due to update labs. Have left multiple messages to schedule patient for visit and to advise of labs. Treating patient for RA. DO you want to refill medication?

## 2017-03-09 ENCOUNTER — Telehealth: Payer: Self-pay | Admitting: Rheumatology

## 2017-03-09 NOTE — Telephone Encounter (Signed)
I left a message on patients voicemail to call, and schedule follow up appt.

## 2017-03-09 NOTE — Telephone Encounter (Signed)
-----   Message from Henriette Combs, LPN sent at 1/85/6314 10:24 AM EDT ----- Regarding: Please schedule patient for follow up visit Please schedule patient for follow visit. Patient was due April 2018. Thanks!

## 2017-03-30 ENCOUNTER — Other Ambulatory Visit: Payer: Self-pay | Admitting: Rheumatology

## 2017-03-30 NOTE — Telephone Encounter (Signed)
Patient is due for a follow up appointment and labs. Multiple attempts have been made to contact the patient. Per Dr. Corliss Skains not able to refill medication

## 2017-04-07 ENCOUNTER — Other Ambulatory Visit: Payer: Self-pay | Admitting: Rheumatology

## 2017-04-19 ENCOUNTER — Other Ambulatory Visit: Payer: Self-pay | Admitting: *Deleted

## 2017-04-19 NOTE — Telephone Encounter (Signed)
Refill request received via fax for MTX. Prescription has been denied as patient is due for labs and a follow up. Multiple attempts made to contact the patient without success to advise.

## 2017-04-20 ENCOUNTER — Other Ambulatory Visit: Payer: Self-pay | Admitting: Rheumatology

## 2017-05-11 NOTE — Progress Notes (Deleted)
Office Visit Note  Patient: Brianna Stafford             Date of Birth: November 10, 1950           MRN: [id number]             PCP: Patient, No Pcp Per Referring: No ref. provider found Visit Date: 05/12/2017 Occupation: _0 @    Subjective:  No chief complaint on file.   History of Present Illness: Brianna Stafford is a 66 y.o. female ***   Activities of Daily Living:  Patient reports morning stiffness for *** {minute/hour:19697}.   Patient {ACTIONS;DENIES/REPORTS:21021675::"Denies"} nocturnal pain.  Difficulty dressing/grooming: {ACTIONS;DENIES/REPORTS:21021675::"Denies"} Difficulty climbing stairs: {ACTIONS;DENIES/REPORTS:21021675::"Denies"} Difficulty getting out of chair: {ACTIONS;DENIES/REPORTS:21021675::"Denies"} Difficulty using hands for taps, buttons, cutlery, and/or writing: {ACTIONS;DENIES/REPORTS:21021675::"Denies"}   No Rheumatology ROS completed.   PMFS History:  Patient Active Problem List   Diagnosis Date Noted  . Dyslipidemia 11/23/2016  . Primary osteoarthritis of both knees 08/23/2016  . Essential hypertension 08/23/2016  . Hypothyroidism 08/23/2016  . High risk medication use 07/20/2016  . Primary osteoarthritis of both hands 07/20/2016  . Primary osteoarthritis of both feet 07/20/2016  . Language barrier 07/20/2016  . Rheumatoid arthritis ([place]) 06/12/2014    Past Medical History:  Diagnosis Date  . Arthritis   . Hypertension     Family History  Problem Relation Age of Onset  . Diabetes Mother   . Cancer Sister    No past surgical history on file. Social History   Social History Narrative  . No narrative on file     Objective: Vital Signs: There were no vitals taken for this visit.   Physical Exam   Musculoskeletal Exam: ***  CDAI Exam: No CDAI exam completed.    Investigation: No additional findings. CBC Latest Ref Rng & Units 08/24/2016 06/06/2014  WBC 3.8 - 10.8 K/uL 7.4 7.7  Hemoglobin 11.7 - 15.5 g/dL 13.8 12.6  Hematocrit 35.0 -  45.0 % 41.5 37.6  Platelets 140 - 400 K/uL 332 433(H)   CMP     Component Value Date/Time   NA 136 08/24/2016 1510   K 4.1 08/24/2016 1510   CL 100 08/24/2016 1510   CO2 27 08/24/2016 1510   GLUCOSE 191 (H) 08/24/2016 1510   BUN 23 08/24/2016 1510   CREATININE 0.94 08/24/2016 1510   CALCIUM 9.6 08/24/2016 1510   PROT 7.3 08/24/2016 1510   ALBUMIN 4.1 08/24/2016 1510   AST 22 08/24/2016 1510   ALT 20 08/24/2016 1510   ALKPHOS 91 08/24/2016 1510   BILITOT 0.3 08/24/2016 1510   GFRNONAA 63 08/24/2016 1510   GFRAA 73 08/24/2016 1510    Imaging: No results found.  Speciality Comments: No specialty comments available.    Procedures:  No procedures performed Allergies: Patient has no known allergies.   Assessment / Plan:     Visit Diagnoses: Rheumatoid arthritis involving multiple sites with positive rheumatoid factor (HCC) - Positive RF, positive anti-CCP, elevated ESR  High risk medication use - MTX, Folic acid, Pred ? Noncompliance with meds.  Primary osteoarthritis of both hands  Primary osteoarthritis of both knees  Primary osteoarthritis of both feet  Essential hypertension  Dyslipidemia  Hypothyroidism  Language barrier    Orders: No orders of the defined types were placed in this encounter.  No orders of the defined types were placed in this encounter.   Face-to-face time spent with patient was *** minutes. 50% of time was spent in counseling and coordination of care.  Follow-Up Instructions: No Follow-up on file.   Bo Merino, MD  Note - This record has been created using Editor, commissioning.  Chart creation errors have been sought, but may not always  have been located. Such creation errors do not reflect on  the standard of medical care.

## 2017-05-12 ENCOUNTER — Ambulatory Visit: Payer: Medicare Other | Admitting: Rheumatology

## 2017-05-12 ENCOUNTER — Encounter: Payer: Self-pay | Admitting: Rheumatology

## 2017-05-12 ENCOUNTER — Ambulatory Visit (INDEPENDENT_AMBULATORY_CARE_PROVIDER_SITE_OTHER): Payer: Medicare Other | Admitting: Rheumatology

## 2017-05-12 VITALS — BP 133/78 | HR 73 | Resp 14 | Ht 62.0 in | Wt 118.0 lb

## 2017-05-12 DIAGNOSIS — E039 Hypothyroidism, unspecified: Secondary | ICD-10-CM

## 2017-05-12 DIAGNOSIS — M0579 Rheumatoid arthritis with rheumatoid factor of multiple sites without organ or systems involvement: Secondary | ICD-10-CM

## 2017-05-12 DIAGNOSIS — M19042 Primary osteoarthritis, left hand: Secondary | ICD-10-CM

## 2017-05-12 DIAGNOSIS — Z79899 Other long term (current) drug therapy: Secondary | ICD-10-CM | POA: Diagnosis not present

## 2017-05-12 DIAGNOSIS — M19041 Primary osteoarthritis, right hand: Secondary | ICD-10-CM

## 2017-05-12 DIAGNOSIS — M170 Bilateral primary osteoarthritis of knee: Secondary | ICD-10-CM

## 2017-05-12 LAB — COMPLETE METABOLIC PANEL WITH GFR
AG RATIO: 1.4 (calc) (ref 1.0–2.5)
ALT: 13 U/L (ref 6–29)
AST: 20 U/L (ref 10–35)
Albumin: 4.1 g/dL (ref 3.6–5.1)
Alkaline phosphatase (APISO): 96 U/L (ref 33–130)
BILIRUBIN TOTAL: 0.5 mg/dL (ref 0.2–1.2)
BUN: 19 mg/dL (ref 7–25)
CALCIUM: 9.5 mg/dL (ref 8.6–10.4)
CO2: 24 mmol/L (ref 20–32)
Chloride: 105 mmol/L (ref 98–110)
Creat: 0.72 mg/dL (ref 0.50–0.99)
GFR, EST AFRICAN AMERICAN: 100 mL/min/{1.73_m2} (ref >=60)
GFR, Est Non African American: 87 mL/min/{1.73_m2} (ref >=60)
GLUCOSE: 100 mg/dL — AB (ref 65–99)
Globulin: 3 g/dL (calc) (ref 1.9–3.7)
POTASSIUM: 4.8 mmol/L (ref 3.5–5.3)
Sodium: 140 mmol/L (ref 135–146)
Total Protein: 7.1 g/dL (ref 6.1–8.1)

## 2017-05-12 LAB — CBC WITH DIFFERENTIAL/PLATELET
BASOS PCT: 0.4 %
Basophils Absolute: 29 cells/uL (ref 0–200)
Eosinophils Absolute: 158 cells/uL (ref 15–500)
Eosinophils Relative: 2.2 %
HCT: 38.1 % (ref 35.0–45.0)
Hemoglobin: 12.9 g/dL (ref 11.7–15.5)
Lymphs Abs: 1152 cells/uL (ref 850–3900)
MCH: 29.6 pg (ref 27.0–33.0)
MCHC: 33.9 g/dL (ref 32.0–36.0)
MCV: 87.4 fL (ref 80.0–100.0)
MONOS PCT: 9.1 %
MPV: 10.9 fL (ref 7.5–12.5)
NEUTROS PCT: 72.3 %
Neutro Abs: 5206 cells/uL (ref 1500–7800)
PLATELETS: 335 10*3/uL (ref 140–400)
RBC: 4.36 10*6/uL (ref 3.80–5.10)
RDW: 12.8 % (ref 11.0–15.0)
TOTAL LYMPHOCYTE: 16 %
WBC: 7.2 10*3/uL (ref 3.8–10.8)
WBCMIX: 655 {cells}/uL (ref 200–950)

## 2017-05-12 MED ORDER — METHOTREXATE 2.5 MG PO TABS: ORAL_TABLET | ORAL | 0 refills | Status: DC

## 2017-05-12 MED ORDER — FOLIC ACID 1 MG PO TABS: 1.0000 mg | ORAL_TABLET | Freq: Every day | ORAL | 3 refills | Status: DC

## 2017-05-12 MED ORDER — PREDNISONE 5 MG PO TABS: ORAL_TABLET | ORAL | 0 refills | Status: DC

## 2017-05-12 NOTE — Progress Notes (Signed)
Office Visit Note  Patient: Brianna Stafford             Date of Birth: 1950/09/13           MRN: [id number]             PCP: Patient, No Pcp Per Referring: No ref. provider found Visit Date: 05/12/2017 Occupation: @GUAROCC @    Subjective:  No chief complaint on file.   History of Present Illness: Brianna Stafford is a 66 y.o. female  Was last seenr office on 08/24/2016 for arthritis and high risk prescription (methotrexate 4 per week, and possibly adding Plaquenil in the future in case of flares and inadequate control with methotrexate only). On the last visit in January, note that patient had recently suffered a flare for which she got treated with prednisone taper. She reported that the flare occurred after she went to [place] and skipped medication for the month of November and December.at the last visit, she had swelling and tenderness to the right shoulder with effusion;  right second MCPright second and third PIP.  She was requested to return back in 3 months(April 2018) but review of her chart reveals that she is just returning now.  Today, c/o of right knee pain and bilateral hand pain.Patient states that when she takes her medication ,methotrexate, she does well. Unfortunatel, she has missed about 2 months of medication I'll she was traveling. In addition, her right knee started hurting because she's been working a lot over the last 2 weeks.  She reports that when she takes her medication, her joints are doing really well.  Patient is past due for her labs. Her last labs that we have is from January 2018. She doesn't have any updated labs for March May July September.  I will update her labs today in our office  Patient will return to office in 3 months and get blood drawn at each office visit as well as RA follow-up due to history of noncompliance with office visits and medication use  Activities of Daily Living:  Patient reports morning stiffness for 30 minutes.   Patient Reports  nocturnal pain.  Difficulty dressing/grooming: Reports Difficulty climbing stairs: Reports Difficulty getting out of chair: Reports Difficulty using hands for taps, buttons, cutlery, and/or writing: Reports   Review of Systems  Constitutional: Negative for fatigue.  HENT: Negative for mouth sores and mouth dryness.   Eyes: Negative for dryness.  Respiratory: Negative for shortness of breath.   Gastrointestinal: Negative for constipation and diarrhea.  Musculoskeletal: Negative for myalgias and myalgias.  Skin: Negative for sensitivity to sunlight.  Psychiatric/Behavioral: Negative for decreased concentration and sleep disturbance.    PMFS History:  Patient Active Problem List   Diagnosis Date Noted  . Dyslipidemia 11/23/2016  . Primary osteoarthritis of both knees 08/23/2016  . Essential hypertension 08/23/2016  . Hypothyroidism 08/23/2016  . High risk medication use 07/20/2016  . Primary osteoarthritis of both hands 07/20/2016  . Primary osteoarthritis of both feet 07/20/2016  . Language barrier 07/20/2016  . Rheumatoid arthritis (HCC) 06/12/2014    Past Medical History:  Diagnosis Date  . Arthritis   . Hypertension     Family History  Problem Relation Age of Onset  . Diabetes Mother   . Cancer Sister    No past surgical history on file. Social History   Social History Narrative  . No narrative on file     Objective: Vital Signs: There were no vitals taken for this  visit.   Physical Exam  Constitutional: She is oriented to person, place, and time. She appears well-developed and well-nourished.  HENT:  Head: Normocephalic and atraumatic.  Eyes: Pupils are equal, round, and reactive to light. EOM are normal.  Cardiovascular: Normal rate, regular rhythm and normal heart sounds.  Exam reveals no gallop and no friction rub.   No murmur heard. Pulmonary/Chest: Effort normal and breath sounds normal. She has no wheezes. She has no rales.  Abdominal: Soft. Bowel  sounds are normal. She exhibits no distension. There is no tenderness. There is no guarding. No hernia.  Musculoskeletal: Normal range of motion. She exhibits no edema, tenderness or deformity.  Lymphadenopathy:    She has no cervical adenopathy.  Neurological: She is alert and oriented to person, place, and time. Coordination normal.  Skin: Skin is warm and dry. Capillary refill takes less than 2 seconds. No rash noted.  Psychiatric: She has a normal mood and affect. Her behavior is normal.  Nursing note and vitals reviewed.    Musculoskeletal Exam:  Decreased range bilateral s joint, bilateral wristsecondary to pain and swelli. Other joints have good range of motion Right knee is warmwith mild swelling. All of these joints are affected with RA flare at this time.  Grip strength is equal and strong bilaterally but painful when she makes a full fist Fibromyalgia tender points are all absent    CDAI Exam: CDAI Homunculus Exam:   Tenderness:  RUE: wrist LUE: glenohumeral and wrist Right hand: 1st MCP and 2nd MCP Left hand: 1st MCP RLE: tibiofemoral  Swelling:  RUE: wrist LUE: glenohumeral and wrist Right hand: 1st MCP and 2nd MCP Left hand: 1st MCP RLE: tibiofemoral  Joint Counts:  CDAI Tender Joint count: 7 CDAI Swollen Joint count: 7  Global Assessments:  Patient Global Assessment: 8 Provider Global Assessment: 8  CDAI Calculated Score: 30    Investigation: No additional findings.  Due to the high TSH levels on 08/24/2016 labs, patient was advised to go to urgent care immediately for evaluation and treatment of elevated TSH. TSH mIU/L 19.76   19.749R     Comment:   Reference Range    > or = 20 Years 0.40-4.50    Pregnancy Range  First trimester 0.26-2.66  Second trimester 0.55-2.73  Third trimester 0.43-2.91    CMP with GFR and CBC with differential are within normal limits in jan 2018. Pt will get updated labs today and every 3 months w/ 3  month office visit.   Imaging: No results found.  Speciality Comments: No specialty comments available.    Procedures:  No procedures performed Allergies: Patient has no known allergies.   Assessment / Plan:     Visit Diagnoses: Rheumatoid arthritis involving multiple sites with positive rheumatoid factor (HCC)  High risk medication use  Hypothyroidism  Primary osteoarthritis of both hands  Primary osteoarthritis of both knees   Lan: #1: Rheumatoid arthritis Positive rheumatoid ; +ccp History of medication and office visit nd lab noncompliance, Complaining of bilateral wrist pain, bilateral hand pain, right knee pain, left shoulder joint pain Patient does well when she takeut of medication some  #2: High risk prescription Methotrexate 4 pills per week Folic acid 1 mg per day Patient has been out of medication for about 6-8 weeks. Patient is past due for labs. Last labs were January 2018 She was instructed to months later but she did not. I emphasized to office visits for labs and I emphasized importance of taking medication  as prescribed Patient is agreeable.As a result of noncompliance with office visits, labs, medication use, I've asked the patient to come every 3 months to our office.  #3: Right knee pain with warmth. Probable flare of rheumatoid arthritis. I'm giving her prednisone taper  #4: Decreased range of motion of bilateral shoulders secondary to flare of rheumatoid arthritis. I'm giving her prednisone taper for this as well  #5: Bilateral wrist and bilateral hand pain and swelling Patient is getting prednisone taper for this as well see above for full details  #6: Refill methotrexate 4 pills per week Dispense 48 pills  #7: Refill folic acid 1 mg per day Ninety-day supply with 4 refills  #8: Prednisone 5 mg taper 4 pills for 4 days; 3 pills for 4 days, 2 pills for 4 days, 1 pill 4 days, half pill for 4 days, stop Patient was given specific  instructions to take the medication until all gone.  #9: Return to clinic in 3 months (history of noncompliance with meds, office visits, labs; We had a long discussion onon importance of being compliant. As such, patient will come to office visit every 3 months and get labs drawn at the same time and we will do med refills accordingly She is aware that use of prednisone is only for emergency and it is best to avoid prednisone when possible.   Orders: No orders of the defined types were placed in this encounter.  No orders of the defined types were placed in this encounter.   Face-to-face time spent with patient was 30 minutes. 50% of time was spent in counseling and coordination of care.  Follow-Up Instructions: No Follow-up on file.   Tawni Pummel, PA-C  Note - This record has been created using [employer].  Chart creation errors have been sought, but may not always  have been located. Such creation errors do not reflect on  the standard of medical care.

## 2017-05-13 ENCOUNTER — Telehealth: Payer: Self-pay | Admitting: Radiology

## 2017-05-13 NOTE — Telephone Encounter (Signed)
-----   Message from [place], [place] sent at 05/13/2017  1:05 PM EDT ----- Please send a copy of labs to PCP Please tell patient #1: CBC with differential is normal #2: CMP with GFR is within normal limits except nonfasting glucose elevated at 100--this is expected and within normal limits)

## 2017-05-13 NOTE — Telephone Encounter (Signed)
I have called patient to advise labs are normal left message 

## 2017-08-08 ENCOUNTER — Other Ambulatory Visit: Payer: Self-pay | Admitting: Rheumatology

## 2017-08-08 NOTE — Telephone Encounter (Signed)
Last Visit: 05/12/17 Next Visit: 09/28/17 Labs: 05/12/17 WNL  Okay to refill per Dr. Corliss Skains

## 2017-09-14 NOTE — Progress Notes (Deleted)
Office Visit Note  Patient: Brianna Stafford             Date of Birth: 11/13/1950           MRN: [id number]             PCP: Patient, No Pcp Per Referring: No ref. provider found Visit Date: 09/28/2017 Occupation: @GUAROCC @    Subjective:  No chief complaint on file.   History of Present Illness: Brianna Stafford is a 67 y.o. female ***   Activities of Daily Living:  Patient reports morning stiffness for *** {minute/hour:19697}.   Patient {ACTIONS;DENIES/REPORTS:21021675::"Denies"} nocturnal pain.  Difficulty dressing/grooming: {ACTIONS;DENIES/REPORTS:21021675::"Denies"} Difficulty climbing stairs: {ACTIONS;DENIES/REPORTS:21021675::"Denies"} Difficulty getting out of chair: {ACTIONS;DENIES/REPORTS:21021675::"Denies"} Difficulty using hands for taps, buttons, cutlery, and/or writing: {ACTIONS;DENIES/REPORTS:21021675::"Denies"}   No Rheumatology ROS completed.   PMFS History:  Patient Active Problem List   Diagnosis Date Noted  . Dyslipidemia 11/23/2016  . Primary osteoarthritis of both knees 08/23/2016  . Essential hypertension 08/23/2016  . Hypothyroidism 08/23/2016  . High risk medication use 07/20/2016  . Primary osteoarthritis of both hands 07/20/2016  . Primary osteoarthritis of both feet 07/20/2016  . Language barrier 07/20/2016  . Rheumatoid arthritis (HCC) 06/12/2014    Past Medical History:  Diagnosis Date  . Arthritis   . Hypertension     Family History  Problem Relation Age of Onset  . Diabetes Mother   . Cancer Sister    No past surgical history on file. Social History   Social History Narrative  . Not on file     Objective: Vital Signs: There were no vitals taken for this visit.   Physical Exam   Musculoskeletal Exam: ***  CDAI Exam: No CDAI exam completed.    Investigation: No additional findings. CBC Latest Ref Rng & Units 05/12/2017 08/24/2016 06/06/2014  WBC 3.8 - 10.8 Thousand/uL 7.2 7.4 7.7  Hemoglobin 11.7 - 15.5 g/dL 13/11/2013 01.7 49.4    Hematocrit 35.0 - 45.0 % 38.1 41.5 37.6  Platelets 140 - 400 Thousand/uL 335 332 433(H)   CMP Latest Ref Rng & Units 05/12/2017 08/24/2016 09/24/2014  Glucose 65 - 99 mg/dL 09/26/2014) 759(F) 638(G)  BUN 7 - 25 mg/dL 19 23 22   Creatinine 0.50 - 0.99 mg/dL 665(L 9.35  Sodium 135 - 146 mmol/L 140 136 137  Potassium 3.5 - 5.3 mmol/L 4.8 4.1 4.2  Chloride 98 - 110 mmol/L 105 100 99  CO2 20 - 32 mmol/L 24 27 23   Calcium 8.6 - 10.4 mg/dL 9.5 9.6 9.8  Total Protein 6.1 - 8.1 g/dL 7.1 7.3 7.6  Total Bilirubin 0.2 - 1.2 mg/dL 0.5 0.3 0.4  Alkaline Phos 33 - 130 U/L - 91 93  AST 10 - 35 U/L 20 22 14   ALT 6 - 29 U/L 13 20 22     Imaging: No results found.  Speciality Comments: No specialty comments available.    Procedures:  No procedures performed Allergies: Patient has no known allergies.   Assessment / Plan:     Visit Diagnoses: No diagnosis found.    Orders: No orders of the defined types were placed in this encounter.  No orders of the defined types were placed in this encounter.   Face-to-face time spent with patient was *** minutes. 50% of time was spent in counseling and coordination of care.  Follow-Up Instructions: No Follow-up on file.   7.01, CMA  Note - This record has been created using [employer].  Chart creation errors  have been sought, but may not always  have been located. Such creation errors do not reflect on  the standard of medical care.

## 2017-09-28 ENCOUNTER — Ambulatory Visit: Payer: Medicare Other | Admitting: Physician Assistant

## 2017-09-28 NOTE — Progress Notes (Deleted)
Office Visit Note  Patient: Brianna Stafford             Date of Birth: May 31, 1951           MRN: [id number]             PCP: Patient, No Pcp Per Referring: No ref. provider found Visit Date: 10/11/2017 Occupation: @GUAROCC @    Subjective:  No chief complaint on file.   History of Present Illness: Brianna Stafford is a 67 y.o. female ***   Activities of Daily Living:  Patient reports morning stiffness for *** {minute/hour:19697}.   Patient {ACTIONS;DENIES/REPORTS:21021675::"Denies"} nocturnal pain.  Difficulty dressing/grooming: {ACTIONS;DENIES/REPORTS:21021675::"Denies"} Difficulty climbing stairs: {ACTIONS;DENIES/REPORTS:21021675::"Denies"} Difficulty getting out of chair: {ACTIONS;DENIES/REPORTS:21021675::"Denies"} Difficulty using hands for taps, buttons, cutlery, and/or writing: {ACTIONS;DENIES/REPORTS:21021675::"Denies"}   No Rheumatology ROS completed.   PMFS History:  Patient Active Problem List   Diagnosis Date Noted  . Dyslipidemia 11/23/2016  . Primary osteoarthritis of both knees 08/23/2016  . Essential hypertension 08/23/2016  . Hypothyroidism 08/23/2016  . High risk medication use 07/20/2016  . Primary osteoarthritis of both hands 07/20/2016  . Primary osteoarthritis of both feet 07/20/2016  . Language barrier 07/20/2016  . Rheumatoid arthritis (HCC) 06/12/2014    Past Medical History:  Diagnosis Date  . Arthritis   . Hypertension     Family History  Problem Relation Age of Onset  . Diabetes Mother   . Cancer Sister    No past surgical history on file. Social History   Social History Narrative  . Not on file     Objective: Vital Signs: There were no vitals taken for this visit.   Physical Exam   Musculoskeletal Exam: ***  CDAI Exam: No CDAI exam completed.    Investigation: No additional findings. CBC Latest Ref Rng & Units 05/12/2017 08/24/2016 06/06/2014  WBC 3.8 - 10.8 Thousand/uL 7.2 7.4 7.7  Hemoglobin 11.7 - 15.5 g/dL 13/11/2013 07.6 22.6    Hematocrit 35.0 - 45.0 % 38.1 41.5 37.6  Platelets 140 - 400 Thousand/uL 335 332 433(H)   CMP Latest Ref Rng & Units 05/12/2017 08/24/2016 09/24/2014  Glucose 65 - 99 mg/dL 09/26/2014) 545(G) 256(L)  BUN 7 - 25 mg/dL 19 23 22   Creatinine 0.50 - 0.99 mg/dL 893(T 3.42  Sodium 135 - 146 mmol/L 140 136 137  Potassium 3.5 - 5.3 mmol/L 4.8 4.1 4.2  Chloride 98 - 110 mmol/L 105 100 99  CO2 20 - 32 mmol/L 24 27 23   Calcium 8.6 - 10.4 mg/dL 9.5 9.6 9.8  Total Protein 6.1 - 8.1 g/dL 7.1 7.3 7.6  Total Bilirubin 0.2 - 1.2 mg/dL 0.5 0.3 0.4  Alkaline Phos 33 - 130 U/L - 91 93  AST 10 - 35 U/L 20 22 14   ALT 6 - 29 U/L 13 20 22     Imaging: No results found.  Speciality Comments: No specialty comments available.    Procedures:  No procedures performed Allergies: Patient has no known allergies.   Assessment / Plan:     Visit Diagnoses: Rheumatoid arthritis involving multiple sites with positive rheumatoid factor (HCC)  High risk medication use  Primary osteoarthritis of both hands  Primary osteoarthritis of both knees  Primary osteoarthritis of both feet  Dyslipidemia  Language barrier  Hypothyroidism  Essential hypertension    Orders: No orders of the defined types were placed in this encounter.  No orders of the defined types were placed in this encounter.   Face-to-face time spent with patient was ***  minutes. 50% of time was spent in counseling and coordination of care.  Follow-Up Instructions: No Follow-up on file.   Gearldine Bienenstock, PA-C  Note - This record has been created using Dragon software.  Chart creation errors have been sought, but may not always  have been located. Such creation errors do not reflect on  the standard of medical care.

## 2017-10-11 ENCOUNTER — Ambulatory Visit: Payer: Medicare Other | Admitting: Physician Assistant

## 2017-12-15 NOTE — Progress Notes (Signed)
Office Visit Note  Patient: Brianna Stafford             Date of Birth: [birth year]           MRN: [id number]             PCP: Patient, No Pcp Per Referring: No ref. provider found Visit Date: 12/20/2017 Occupation: @GUAROCC @    Subjective:  Pain in multiple joints   History of Present Illness: Brianna Stafford is a 67 y.o. female with history of seropositive rheumatoid arthritis and osteoarthritis.  Patient has been off of methotrexate for about 1 month due to running out of her prescription.  She was recently taking methotrexate 4 tablets by mouth once weekly and folic acid 1 mg daily.  She reports that her her last visit on 05/12/2017 she was started on prednisone.  She has not returned for labs since October.  She is having pain in bilateral hands and bilateral feet.  She is also having swelling in her hands and feet.  She denies any knee pain at this time.  She states that she has been using her hands frequently which has been causing increased discomfort.  She states she is also having significant joint stiffness first in the morning lasting about 2 hours.   Activities of Daily Living:  Patient reports morning stiffness for 2 hours.   Patient Denies nocturnal pain.  Difficulty dressing/grooming: Denies Difficulty climbing stairs: Denies Difficulty getting out of chair: Denies Difficulty using hands for taps, buttons, cutlery, and/or writing: Reports   Review of Systems  Constitutional: Positive for fatigue.  HENT: Positive for mouth dryness. Negative for mouth sores and nose dryness.   Eyes: Negative for pain, visual disturbance and dryness.  Respiratory: Negative for cough, hemoptysis, shortness of breath and difficulty breathing.   Cardiovascular: Positive for swelling in legs/feet. Negative for chest pain, palpitations and hypertension.  Gastrointestinal: Positive for diarrhea. Negative for blood in stool and constipation.  Endocrine: Negative for increased urination.  Genitourinary:  Negative for difficulty urinating and painful urination.  Musculoskeletal: Positive for joint swelling and morning stiffness. Negative for arthralgias, joint pain, myalgias, muscle weakness, muscle tenderness and myalgias.  Skin: Negative for color change, pallor, rash, hair loss, nodules/bumps, skin tightness, ulcers and sensitivity to sunlight.  Allergic/Immunologic: Negative for susceptible to infections.  Neurological: Negative for dizziness, numbness, headaches and weakness.  Hematological: Negative for bruising/bleeding tendency and swollen glands.  Psychiatric/Behavioral: Negative for depressed mood and sleep disturbance. The patient is not nervous/anxious.     PMFS History:  Patient Active Problem List   Diagnosis Date Noted  . Dyslipidemia 11/23/2016  . Primary osteoarthritis of both knees 08/23/2016  . Essential hypertension 08/23/2016  . Hypothyroidism 08/23/2016  . High risk medication use 07/20/2016  . Primary osteoarthritis of both hands 07/20/2016  . Primary osteoarthritis of both feet 07/20/2016  . Language barrier 07/20/2016  . Rheumatoid arthritis ([place]) 06/12/2014    Past Medical History:  Diagnosis Date  . Arthritis   . Hypertension     Family History  Problem Relation Age of Onset  . Diabetes Mother   . Cancer Sister    History reviewed. No pertinent surgical history. Social History   Social History Narrative  . Not on file     Objective: Vital Signs: BP (!) 147/86 (BP Location: Left Arm, Patient Position: Sitting, Cuff Size: Normal)   Pulse 66   Resp 14   Ht 5' 3"  (1.6 m)   Wt 121 lb (  54.9 kg)   BMI 21.43 kg/m    Physical Exam  Constitutional: She is oriented to person, place, and time. She appears well-developed and well-nourished.  HENT:  Head: Normocephalic and atraumatic.  Eyes: Conjunctivae and EOM are normal.  Neck: Normal range of motion.  Cardiovascular: Normal rate, regular rhythm, normal heart sounds and intact distal pulses.    Pulmonary/Chest: Effort normal and breath sounds normal.  Abdominal: Soft. Bowel sounds are normal.  Lymphadenopathy:    She has no cervical adenopathy.  Neurological: She is alert and oriented to person, place, and time.  Skin: Skin is warm and dry. Capillary refill takes less than 2 seconds.  Psychiatric: She has a normal mood and affect. Her behavior is normal.  Nursing note and vitals reviewed.    Musculoskeletal Exam: C-spine, thoracic spine, lumbar spine good range of motion.  No midline spinal tenderness.  No SI joint tenderness.  She has synovitis of her right shoulder with good range of motion with no discomfort.  Left shoulder good range of motion no synovitis.  Elbow joints, wrist joints, MCPs, PIPs, DIPs good range of motion.  She has PIP and DIP synovial thickening consistent with osteoarthritis of bilateral hands.  She has complete fist formation bilaterally.  She has synovitis of her right first and second MCP and several PIP joints as described below.  She has synovitis in the right ulnar styloid region.  Hip joints, knee joints, ankle joints, MTPs, PIPs, DIPs good range of motion with no synovitis.  She has mild warmth of her bilateral knees.  She has tenderness of all MTPs as well as synovitis.  She has PIP and DIP synovial thickening consistent with osteoarthritis of bilateral feet.  CDAI Exam: CDAI Homunculus Exam:   Tenderness:  RUE: glenohumeral and wrist Right hand: 1st MCP, 2nd MCP, 3rd PIP, 4th PIP and 5th PIP  Swelling:  RUE: glenohumeral and wrist Right hand: 1st MCP, 2nd MCP, 3rd PIP, 4th PIP and 5th PIP Right foot: 1st MTP, 2nd MTP, 3rd MTP, 4th MTP and 5th MTP Left foot: 1st MTP, 2nd MTP, 3rd MTP, 4th MTP and 5th MTP  Joint Counts:  CDAI Tender Joint count: 7 CDAI Swollen Joint count: 7  Global Assessments:  Patient Global Assessment: 7 Provider Global Assessment: 7  CDAI Calculated Score: 28    Investigation: No additional findings. CBC Latest  Ref Rng & Units 05/12/2017 08/24/2016 06/06/2014  WBC 3.8 - 10.8 Thousand/uL 7.2 7.4 7.7  Hemoglobin 11.7 - 15.5 g/dL 12.9 13.8 12.6  Hematocrit 35.0 - 45.0 % 38.1 41.5 37.6  Platelets 140 - 400 Thousand/uL 335 332 433(H)   CMP Latest Ref Rng & Units 05/12/2017 08/24/2016 09/24/2014  Glucose 65 - 99 mg/dL 100(H) 191(H) 101(H)  BUN 7 - 25 mg/dL 19 23 22   Creatinine 0.50 - 0.99 mg/dL 0.72 0.94 0.73  Sodium 135 - 146 mmol/L 140 136 137  Potassium 3.5 - 5.3 mmol/L 4.8 4.1 4.2  Chloride 98 - 110 mmol/L 105 100 99  CO2 20 - 32 mmol/L 24 27 23   Calcium 8.6 - 10.4 mg/dL 9.5 9.6 9.8  Total Protein 6.1 - 8.1 g/dL 7.1 7.3 7.6  Total Bilirubin 0.2 - 1.2 mg/dL 0.5 0.3 0.4  Alkaline Phos 33 - 130 U/L - 91 93  AST 10 - 35 U/L 20 22 14   ALT 6 - 29 U/L 13 20 22     Imaging: No results found.  Speciality Comments: No specialty comments available.    Procedures:  No procedures performed Allergies: Patient has no known allergies.    Assessment / Plan:     Visit Diagnoses: Rheumatoid arthritis involving multiple sites with positive rheumatoid factor (HCC) - Positive RF, positive anti-CCP, elevated ESR: She has active synovitis in the right ulnar styloid region, right first and second MCP joints, and several PIP joints as described above.  She has warmth and swelling of her right shoulder but no discomfort or tenderness at this time.  She has mild warmth of bilateral knees on exam.  She has tenderness and synovitis of all MTP joints.  She has been off of her methotrexate for about 4 weeks.  She was previously taking methotrexate 4 tablets once weekly and folic acid 1 mg daily.  She needs a refill of both medications.  She has not had lab work since 05/12/2017.  We will obtain CBC and CMP today to monitor for drug toxicity.  Due to her increased joint pain and joint swelling we will start her on prednisone taper starting at 20 mg tapering by 5 mg every 4 days.  Due to her history of noncompliance with  medications and office visits we will schedule a follow-up visit in 3 months.  She will return in August and every 3 months for lab work.  Medication counseling:  TB Test: Pending Hepatitis panel: Pending  HIV: Pending  SPEP: Pending Immunoglobulins: Pending  Chest x-ray: Normal CXR on 09/18/14  Does patient have diagnosis of heart failure?  No  Counseled patient that Cimzia is a TNF blocking agent.  Reviewed Cimzia .  Counseled patient on purpose, proper use, and adverse effects of Cimzia.  Reviewed the most common adverse effects including infections, headache, and injection site reactions. Discussed that there is the possibility of an increased risk of malignancy but it is not well understood if this increased risk is due to the medication or the disease state.  Advised patient to get yearly dermatology exams due to risk of skin cancer.  Reviewed the importance of regular labs while on Cimzia therapy.  Advised patient to get standing labs one month after starting Cimzia.  Provided patient with standing lab orders.  Counseled patient that Cimzia should be held prior to scheduled surgery.  Counseled patient to avoid live vaccines while on Cimzia.  Advised patient to get annual influenza vaccine and the pneumococcal vaccine as indicated.  Provided patient with medication education material and answered all questions.  Patient voiced understanding.  Patient consented to Cimzia.  Will upload consent into the media tab.  Reviewed storage instructions for Cimzia.  Will apply for Cimzia through patient's insurance.  Advised initial injection must be administered in office.    High risk medication use -methotrexate 6 tablets once weekly and folic acid 1 mg daily.  CBC and CMP will be drawn today.  Before starting on Cimzia we will check HIV, hep panel, immunoglobulins, SPEP, and TB gold.- Plan: CBC with Differential/Platelet, COMPLETE METABOLIC PANEL WITH GFR, HIV antibody, QuantiFERON-TB Gold Plus, Serum  protein electrophoresis with reflex, IgG, IgA, IgM, Hepatitis C antibody, Hepatitis B surface antigen, Hepatitis B core antibody, IgM  Primary osteoarthritis of both hands:She has PIP and DIP synovial thickening consistent with osteoarthritis of bilateral hands.  Joint protection and muscle strengthening were discussed.  Primary osteoarthritis of both knees: She has warmth of bilateral knee joints.  She has no discomfort in her knees at this time.  Primary osteoarthritis of both feet: She has PIP and DIP synovial thickening consistent with osteoarthritis  of bilateral feet.  She also has tenderness and synovitis of all MTP joints.  We discussed the importance of wearing proper fitting shoes.  Other medical conditions are listed as follows:   Essential hypertension  History of hypothyroidism  Dyslipidemia  Language barrier    Orders: Orders Placed This Encounter  Procedures  . CBC with Differential/Platelet  . COMPLETE METABOLIC PANEL WITH GFR  . HIV antibody  . QuantiFERON-TB Gold Plus  . Serum protein electrophoresis with reflex  . IgG, IgA, IgM  . Hepatitis C antibody  . Hepatitis B surface antigen  . Hepatitis B core antibody, IgM   Meds ordered this encounter  Medications  . methotrexate (RHEUMATREX) 2.5 MG tablet    Sig: Take 6 tablets by mouth once weekly.    Dispense:  72 tablet    Refill:  0  . folic acid (FOLVITE) 1 MG tablet    Sig: Take 1 tablet (1 mg total) by mouth daily.    Dispense:  90 tablet    Refill:  3  . predniSONE (DELTASONE) 5 MG tablet    Sig: Take 4 tablets by mouth for 4 days, 3 tablets for 4 days, 2 tablets for 4 days, 1 tablet for 4 days.    Dispense:  40 tablet    Refill:  0    Face-to-face time spent with patient was 30 minutes.> 50% of time was spent in counseling and coordination of care.  Follow-Up Instructions: Return in about 3 months (around 03/22/2018) for Rheumatoid arthritis, Osteoarthritis.   Ofilia Neas, PA-C   I examined  and evaluated the patient with Hazel Sams PA.  Patient continues to have significant synovitis in multiple joints on examination today.  She has been noncompliant with the methotrexate.  We had detailed discussion regarding medication and their side effects.  Methotrexate may not be enough to control her disease process.  We discussed increasing methotrexate to 6 tablets p.o. weekly.  She does not want to take any injectable medications.  I discussed the option of subcu Cimzia which can be administered in the office.  She was in agreement.  I will obtain some labs today.  If the labs are normal we can apply for subcu Cimzia which can be administered in the office.  The plan of care was discussed as noted above.  Bo Merino, MD Note - This record has been created using Editor, commissioning.  Chart creation errors have been sought, but may not always  have been located. Such creation errors do not reflect on  the standard of medical care.

## 2017-12-20 ENCOUNTER — Ambulatory Visit (INDEPENDENT_AMBULATORY_CARE_PROVIDER_SITE_OTHER): Payer: Medicare Other | Admitting: Rheumatology

## 2017-12-20 ENCOUNTER — Encounter: Payer: Self-pay | Admitting: Physician Assistant

## 2017-12-20 VITALS — BP 147/86 | HR 66 | Resp 14 | Ht 63.0 in | Wt 121.0 lb

## 2017-12-20 DIAGNOSIS — M0579 Rheumatoid arthritis with rheumatoid factor of multiple sites without organ or systems involvement: Secondary | ICD-10-CM | POA: Diagnosis not present

## 2017-12-20 DIAGNOSIS — I10 Essential (primary) hypertension: Secondary | ICD-10-CM | POA: Diagnosis not present

## 2017-12-20 DIAGNOSIS — E785 Hyperlipidemia, unspecified: Secondary | ICD-10-CM

## 2017-12-20 DIAGNOSIS — M19072 Primary osteoarthritis, left ankle and foot: Secondary | ICD-10-CM | POA: Diagnosis not present

## 2017-12-20 DIAGNOSIS — Z789 Other specified health status: Secondary | ICD-10-CM | POA: Diagnosis not present

## 2017-12-20 DIAGNOSIS — M170 Bilateral primary osteoarthritis of knee: Secondary | ICD-10-CM | POA: Diagnosis not present

## 2017-12-20 DIAGNOSIS — Z603 Acculturation difficulty: Secondary | ICD-10-CM

## 2017-12-20 DIAGNOSIS — Z79899 Other long term (current) drug therapy: Secondary | ICD-10-CM | POA: Diagnosis not present

## 2017-12-20 DIAGNOSIS — M19041 Primary osteoarthritis, right hand: Secondary | ICD-10-CM | POA: Diagnosis not present

## 2017-12-20 DIAGNOSIS — M19042 Primary osteoarthritis, left hand: Secondary | ICD-10-CM | POA: Diagnosis not present

## 2017-12-20 DIAGNOSIS — Z8639 Personal history of other endocrine, nutritional and metabolic disease: Secondary | ICD-10-CM

## 2017-12-20 DIAGNOSIS — M19071 Primary osteoarthritis, right ankle and foot: Secondary | ICD-10-CM

## 2017-12-20 MED ORDER — METHOTREXATE 2.5 MG PO TABS: ORAL_TABLET | ORAL | 0 refills | Status: DC

## 2017-12-20 MED ORDER — FOLIC ACID 1 MG PO TABS: 1.0000 mg | ORAL_TABLET | Freq: Every day | ORAL | 3 refills | Status: DC

## 2017-12-20 MED ORDER — PREDNISONE 5 MG PO TABS: ORAL_TABLET | ORAL | 0 refills | Status: DC

## 2017-12-20 NOTE — Patient Instructions (Addendum)
Standing Labs We placed an order today for your standing lab work.    Please come back and get your standing labs in 2 weeks, then 2 months, then every 3 months    We have open lab Monday through Friday from 8:30-11:30 AM and 1:30-4:00 PM  at the office of Dr. Pollyann Savoy.   You may experience shorter wait times on Monday and Friday afternoons. The office is located at [address], [address], [place], [place] [address] No appointment is necessary.   Labs are drawn by [facility].  You may receive a bill from [facility] for your lab work. If you have any questions regarding directions or hours of operation,  please call [phone].    Certolizumab pegol injection What is this medicine? CERTOLIZUMAB (SER toe LIZ oo mab) is used to treat Crohn's disease, psoriatic arthritis, or rheumatoid arthritis. This medicine may be used for other purposes; ask your health care provider or pharmacist if you have questions. COMMON BRAND NAME(S): Cimzia What should I tell my health care provider before I take this medicine? They need to know if you have any of these conditions: -diabetes -heart disease -hepatitis B or history of hepatitis B infection -immune system problems -infection or history of infections -low blood counts, like low white cell, platelet, or red cell counts -multiple sclerosis -recently received or scheduled to receive a vaccine -scheduled to have surgery -tuberculosis, a positive skin test for tuberculosis or have recently been in close contact with someone who has tuberculosis -an unusual or allergic reaction to certolizumab, other medicines, foods, dyes, or preservatives -pregnant or trying to get pregnant -breast-feeding How should I use this medicine? This medicine is for injection under the skin. It is usually given by a health care professional in a hospital or clinic setting. If you get this medicine at home, you will be taught how to prepare and give this medicine.  Use exactly as directed. Take your medicine at regular intervals. Do not take your medicine more often than directed. It is important that you put your used needles and syringes in a special sharps container. Do not put them in a trash can. If you do not have a sharps container, call your pharmacist or healthcare provider to get one. A special MedGuide will be given to you by the pharmacist with each prescription and refill. Be sure to read this information carefully each time. Talk to your pediatrician regarding the use of this medicine in children. Special care may be needed. Overdosage: If you think you have taken too much of this medicine contact a poison control center or emergency room at once. NOTE: This medicine is only for you. Do not share this medicine with others. What if I miss a dose? It is important not to miss your dose. Call your doctor or health care professional if you are unable to keep an appointment. If you give yourself the medicine and you miss a dose, take it as soon as you can. If it is almost time for your next dose, take only that dose. Do not take double or extra doses. What may interact with this medicine? Do not take this medicine with any of the following medications: -abatacept -adalimumab -anakinra -etanercept -infliximab -live virus vaccines -rilonacept This medicine may also interact with the following medications: -vaccines This list may not describe all possible interactions. Give your health care provider a list of all the medicines, herbs, non-prescription drugs, or dietary supplements you use. Also tell them if you  smoke, drink alcohol, or use illegal drugs. Some items may interact with your medicine. What should I watch for while using this medicine? Visit your doctor or health care professional for regular checks on your progress. Tell your doctor or healthcare professional if your symptoms do not start to get better or if they get worse. Your condition  will be monitored carefully while you are receiving this medicine. You will be tested for tuberculosis (TB) before you start this medicine. If your doctor prescribes any medicine for TB, you should start taking the TB medicine before starting this medicine. Make sure to finish the full course of TB medicine. Call your doctor or health care professional for advice if you get a fever, chills, sore throat, or other symptoms of an infection. Do not treat yourself. This medicine may decrease your body's ability to fight infection. Try to avoid being around people who are sick. Talk to your doctor about your risk of cancer. You may be more at risk for certain types of cancers if you take this medicine. What side effects may I notice from receiving this medicine? Side effects that you should report to your doctor or health care professional as soon as possible: -allergic reactions like skin rash, itching or hives, swelling of the face, lips, or tongue -breathing problems -changes in vision -chest pain or palpitations -fever or chills, sore throat -pain, tingling, numbness in the hands or feet -red, scaly patches or raised bumps on the skin -seizures -swelling of the ankles, feet, hands -swollen lymph nodes in the neck, underarm, or groin areas -unexplained weight loss -unusual bleeding or bruising -unusually weak or tired Side effects that usually do not require medical attention (report to your doctor or health care professional if they continue or are bothersome): -irritation at site where injected This list may not describe all possible side effects. Call your doctor for medical advice about side effects. You may report side effects to FDA at 1-800-FDA-1088. Where should I keep my medicine? Keep out of the reach of children. If you are using this medicine at home, you will be instructed on how to store this medicine. Throw away any unused medicine after the expiration date on the label. NOTE:  This sheet is a summary. It may not cover all possible information. If you have questions about this medicine, talk to your doctor, pharmacist, or health care provider.  2018 Elsevier/Gold Standard (2012-05-03 10:31:30)

## 2017-12-23 LAB — QUANTIFERON-TB GOLD PLUS
MITOGEN-NIL: 3.51 [IU]/mL
NIL: 0.03 IU/mL
QUANTIFERON-TB GOLD PLUS: NEGATIVE
TB1-NIL: 0.01 [IU]/mL
TB2-NIL: 0 [IU]/mL

## 2017-12-23 LAB — COMPLETE METABOLIC PANEL WITH GFR
AG Ratio: 1.5 (calc) (ref 1.0–2.5)
ALT: 15 U/L (ref 6–29)
AST: 19 U/L (ref 10–35)
Albumin: 4.4 g/dL (ref 3.6–5.1)
Alkaline phosphatase (APISO): 117 U/L (ref 33–130)
BILIRUBIN TOTAL: 0.3 mg/dL (ref 0.2–1.2)
BUN: 16 mg/dL (ref 7–25)
CALCIUM: 9.6 mg/dL (ref 8.6–10.4)
CO2: 29 mmol/L (ref 20–32)
Chloride: 102 mmol/L (ref 98–110)
Creat: 0.74 mg/dL (ref 0.50–0.99)
GFR, EST AFRICAN AMERICAN: 96 mL/min/{1.73_m2} (ref >=60)
GFR, Est Non African American: 83 mL/min/{1.73_m2} (ref >=60)
GLUCOSE: 114 mg/dL — AB (ref 65–99)
Globulin: 3 g/dL (calc) (ref 1.9–3.7)
Potassium: 4.2 mmol/L (ref 3.5–5.3)
Sodium: 139 mmol/L (ref 135–146)
TOTAL PROTEIN: 7.4 g/dL (ref 6.1–8.1)

## 2017-12-23 LAB — CBC WITH DIFFERENTIAL/PLATELET
BASOS ABS: 37 {cells}/uL (ref 0–200)
Basophils Relative: 0.5 %
EOS ABS: 141 {cells}/uL (ref 15–500)
Eosinophils Relative: 1.9 %
HEMATOCRIT: 38.7 % (ref 35.0–45.0)
Hemoglobin: 13.1 g/dL (ref 11.7–15.5)
LYMPHS ABS: 1421 {cells}/uL (ref 850–3900)
MCH: 30 pg (ref 27.0–33.0)
MCHC: 33.9 g/dL (ref 32.0–36.0)
MCV: 88.8 fL (ref 80.0–100.0)
MPV: 10.3 fL (ref 7.5–12.5)
Monocytes Relative: 7.3 %
NEUTROS PCT: 71.1 %
Neutro Abs: 5261 cells/uL (ref 1500–7800)
Platelets: 368 10*3/uL (ref 140–400)
RBC: 4.36 10*6/uL (ref 3.80–5.10)
RDW: 12.1 % (ref 11.0–15.0)
Total Lymphocyte: 19.2 %
WBC: 7.4 10*3/uL (ref 3.8–10.8)
WBCMIX: 540 {cells}/uL (ref 200–950)

## 2017-12-23 LAB — HEPATITIS B CORE ANTIBODY, IGM: Hep B C IgM: NONREACTIVE

## 2017-12-23 LAB — IGG, IGA, IGM
IMMUNOGLOBULIN A: 262 mg/dL (ref 81–463)
IgG (Immunoglobin G), Serum: 1059 mg/dL (ref 694–1618)
IgM, Serum: 95 mg/dL (ref 48–271)

## 2017-12-23 LAB — HEPATITIS C ANTIBODY
Hepatitis C Ab: NONREACTIVE
SIGNAL TO CUT-OFF: 0.01 (ref <1.00)

## 2017-12-23 LAB — PROTEIN ELECTROPHORESIS, SERUM, WITH REFLEX
ALBUMIN ELP: 4.3 g/dL (ref 3.8–4.8)
Alpha 1: 0.4 g/dL — ABNORMAL HIGH (ref 0.2–0.3)
Alpha 2: 0.9 g/dL (ref 0.5–0.9)
BETA 2: 0.5 g/dL (ref 0.2–0.5)
Beta Globulin: 0.5 g/dL (ref 0.4–0.6)
GAMMA GLOBULIN: 1 g/dL (ref 0.8–1.7)
Total Protein: 7.6 g/dL (ref 6.1–8.1)

## 2017-12-23 LAB — IFE INTERPRETATION: IMMUNOFIX ELECTR INT: NOT DETECTED

## 2017-12-23 LAB — HIV ANTIBODY (ROUTINE TESTING W REFLEX): HIV 1&2 Ab, 4th Generation: NONREACTIVE

## 2017-12-23 LAB — HEPATITIS B SURFACE ANTIGEN: HEP B S AG: NONREACTIVE

## 2018-02-13 ENCOUNTER — Telehealth: Payer: Self-pay | Admitting: *Deleted

## 2018-02-13 NOTE — Telephone Encounter (Signed)
Attempted to contact patient and left message for patient to call the office.  

## 2018-02-15 NOTE — Telephone Encounter (Signed)
Patient has been scheduled for an appointment 02/22/18 at 2:45 pm.

## 2018-02-22 ENCOUNTER — Ambulatory Visit: Payer: Self-pay

## 2018-03-09 NOTE — Progress Notes (Deleted)
Office Visit Note  Patient: Brianna Stafford             Date of Birth: 1950-10-22           MRN: [id number]             PCP: Patient, No Pcp Per Referring: No ref. provider found Visit Date: 03/23/2018 Occupation: @GUAROCC @  Subjective:  No chief complaint on file.   History of Present Illness: Brianna Stafford is a 67 y.o. female ***   Activities of Daily Living:  Patient reports morning stiffness for *** {minute/hour:19697}.   Patient {ACTIONS;DENIES/REPORTS:21021675::"Denies"} nocturnal pain.  Difficulty dressing/grooming: {ACTIONS;DENIES/REPORTS:21021675::"Denies"} Difficulty climbing stairs: {ACTIONS;DENIES/REPORTS:21021675::"Denies"} Difficulty getting out of chair: {ACTIONS;DENIES/REPORTS:21021675::"Denies"} Difficulty using hands for taps, buttons, cutlery, and/or writing: {ACTIONS;DENIES/REPORTS:21021675::"Denies"}  No Rheumatology ROS completed.   PMFS History:  Patient Active Problem List   Diagnosis Date Noted  . Dyslipidemia 11/23/2016  . Primary osteoarthritis of both knees 08/23/2016  . Essential hypertension 08/23/2016  . Hypothyroidism 08/23/2016  . High risk medication use 07/20/2016  . Primary osteoarthritis of both hands 07/20/2016  . Primary osteoarthritis of both feet 07/20/2016  . Language barrier 07/20/2016  . Rheumatoid arthritis ([facility]) 06/12/2014    Past Medical History:  Diagnosis Date  . Arthritis   . Hypertension     Family History  Problem Relation Age of Onset  . Diabetes Mother   . Cancer Sister    No past surgical history on file. Social History   Social History Narrative  . Not on file    Objective: Vital Signs: There were no vitals taken for this visit.   Physical Exam   Musculoskeletal Exam: ***  CDAI Exam: CDAI Score: Not documented Patient Global Assessment: Not documented; Provider Global Assessment: Not documented Swollen: Not documented; Tender: Not documented Joint Exam   Not documented   There is currently no  information documented on the homunculus. Go to the Rheumatology activity and complete the homunculus joint exam.  Investigation: No additional findings.  Imaging: No results found.  Recent Labs: Lab Results  Component Value Date   WBC 7.4 12/20/2017   HGB 13.1 12/20/2017   PLT 368 12/20/2017   NA 139 12/20/2017   K 4.2 12/20/2017   CL 102 12/20/2017   CO2 29 12/20/2017   GLUCOSE 114 (H) 12/20/2017   BUN 16 12/20/2017   CREATININE 0.74 12/20/2017   BILITOT 0.3 12/20/2017   ALKPHOS 91 08/24/2016   AST 19 12/20/2017   ALT 15 12/20/2017   PROT 7.4 12/20/2017   PROT 7.6 12/20/2017   ALBUMIN 4.1 08/24/2016   CALCIUM 9.6 12/20/2017   GFRAA 96 12/20/2017   QFTBGOLDPLUS NEGATIVE 12/20/2017    Speciality Comments: No specialty comments available.  Procedures:  No procedures performed Allergies: Patient has no known allergies.   Assessment / Plan:     Visit Diagnoses: Rheumatoid arthritis involving multiple sites with positive rheumatoid factor (HCC)  High risk medication use - MTX and folic acid   Primary osteoarthritis of both hands  Primary osteoarthritis of both feet  Spondylopathy, unspecified  Primary osteoarthritis of both knees  Essential hypertension  History of hypothyroidism  Dyslipidemia  Language barrier   Orders: No orders of the defined types were placed in this encounter.  No orders of the defined types were placed in this encounter.   Face-to-face time spent with patient was *** minutes. Greater than 50% of time was spent in counseling and coordination of care.  Follow-Up Instructions: No follow-ups on file.  Ofilia Neas, PA-C  Note - This record has been created using Dragon software.  Chart creation errors have been sought, but may not always  have been located. Such creation errors do not reflect on  the standard of medical care.

## 2018-03-11 ENCOUNTER — Other Ambulatory Visit: Payer: Self-pay | Admitting: Physician Assistant

## 2018-03-13 NOTE — Telephone Encounter (Signed)
Last visit: 12/20/2017 Next visit: 03/23/2018 Labs: 12/20/2017 Glucose 114. IFE revealed no monoclonal proteins detected. All other labs are WNL.  Okay to refill per Dr. Corliss Skains.

## 2018-03-23 ENCOUNTER — Ambulatory Visit: Payer: Medicare Other | Admitting: Physician Assistant

## 2018-06-16 ENCOUNTER — Other Ambulatory Visit: Payer: Self-pay | Admitting: Rheumatology

## 2018-10-27 ENCOUNTER — Telehealth: Payer: Self-pay | Admitting: Rheumatology

## 2018-10-27 NOTE — Telephone Encounter (Signed)
Patient left a voicemail requesting a one time medication refill so she doesn't have to come into the office.

## 2018-10-27 NOTE — Telephone Encounter (Signed)
Attempted to contacted the patient and left message for patient to call the office. Patient will need an appointment and labs prior to refill. Last appointment and labs were 11/2017.

## 2018-10-30 ENCOUNTER — Other Ambulatory Visit: Payer: Self-pay | Admitting: *Deleted

## 2018-10-30 ENCOUNTER — Telehealth: Payer: Self-pay | Admitting: Rheumatology

## 2018-10-30 DIAGNOSIS — Z79899 Other long term (current) drug therapy: Secondary | ICD-10-CM

## 2018-10-30 LAB — CBC WITH DIFFERENTIAL/PLATELET
ABSOLUTE MONOCYTES: 524 {cells}/uL (ref 200–950)
Basophils Absolute: 32 cells/uL (ref 0–200)
Basophils Relative: 0.6 %
EOS ABS: 140 {cells}/uL (ref 15–500)
Eosinophils Relative: 2.6 %
HCT: 42 % (ref 35.0–45.0)
HEMOGLOBIN: 14.2 g/dL (ref 11.7–15.5)
LYMPHS ABS: 1172 {cells}/uL (ref 850–3900)
MCH: 30.6 pg (ref 27.0–33.0)
MCHC: 33.8 g/dL (ref 32.0–36.0)
MCV: 90.5 fL (ref 80.0–100.0)
MONOS PCT: 9.7 %
MPV: 10.6 fL (ref 7.5–12.5)
NEUTROS ABS: 3532 {cells}/uL (ref 1500–7800)
Neutrophils Relative %: 65.4 %
Platelets: 299 10*3/uL (ref 140–400)
RBC: 4.64 10*6/uL (ref 3.80–5.10)
RDW: 13 % (ref 11.0–15.0)
Total Lymphocyte: 21.7 %
WBC: 5.4 10*3/uL (ref 3.8–10.8)

## 2018-10-30 LAB — COMPLETE METABOLIC PANEL WITH GFR
AG RATIO: 1.7 (calc) (ref 1.0–2.5)
ALT: 25 U/L (ref 6–29)
AST: 24 U/L (ref 10–35)
Albumin: 4.5 g/dL (ref 3.6–5.1)
Alkaline phosphatase (APISO): 86 U/L (ref 37–153)
BUN: 14 mg/dL (ref 7–25)
CALCIUM: 9.5 mg/dL (ref 8.6–10.4)
CO2: 25 mmol/L (ref 20–32)
CREATININE: 0.83 mg/dL (ref 0.50–0.99)
Chloride: 105 mmol/L (ref 98–110)
GFR, EST AFRICAN AMERICAN: 83 mL/min/{1.73_m2} (ref >=60)
GFR, EST NON AFRICAN AMERICAN: 72 mL/min/{1.73_m2} (ref >=60)
Globulin: 2.7 g/dL (calc) (ref 1.9–3.7)
Glucose, Bld: 157 mg/dL — ABNORMAL HIGH (ref 65–99)
Potassium: 4.5 mmol/L (ref 3.5–5.3)
Sodium: 140 mmol/L (ref 135–146)
TOTAL PROTEIN: 7.2 g/dL (ref 6.1–8.1)
Total Bilirubin: 0.3 mg/dL (ref 0.2–1.2)

## 2018-10-30 NOTE — Telephone Encounter (Signed)
Patient left a voicemail requesting a return call regarding her labwork orders.

## 2018-10-30 NOTE — Progress Notes (Deleted)
Virtual Visit via Video Note  I connected with Brianna Stafford on 10/31/18 at  9:45 AM EDT by a video enabled telemedicine application and verified that I am speaking with the correct person using two identifiers.   I discussed the limitations of evaluation and management by telemedicine and the availability of in person appointments. The patient expressed understanding and agreed to proceed.  CC: Medication monitoring   History of Present Illness: Patient is a 68 year old female with a history of seropositive rheumatoid arthritis and osteoarthritis.  She is taking MTX 3 tablets po once weekly and folic acid 1 mg po daily.  She has been taking MTX 3 tablets weekly for the past 3 months due to running low on the prescription.  She was consented to start on Cimzia at her last visit on 12/20/17.  She did not schedule a nurse visit at that time.   She denies any joint pain or joint swelling.   She denies any morning stiffness.  She has not taken prednisone recently.  She needs refills of MTX and folic acid.     Review of Systems  Constitutional: Negative for fever and malaise/fatigue.  Eyes: Negative for photophobia, pain and redness.  Respiratory: Negative for cough, shortness of breath and wheezing.   Cardiovascular: Negative for chest pain and palpitations.  Gastrointestinal: Negative for blood in stool, constipation, diarrhea and nausea.  Genitourinary: Negative for dysuria.  Musculoskeletal: Negative for back pain, joint pain, myalgias and neck pain.  Skin: Negative for rash.  Neurological: Negative for dizziness and headaches.  Psychiatric/Behavioral: Negative for depression. The patient is nervous/anxious.    Observations/Objective: Denies morning stiffness. Denies difficulty getting up from chair.  Denies difficulty climbing steps.   Denies difficulty writing or using silverware.  Denies nocturnal pain . Physical Exam  Constitutional: She is oriented to person, place, and time.   Neurological: She is alert and oriented to person, place, and time.  Psychiatric: Mood, memory, affect and judgment normal.   Assessment and Plan: Rheumatoid arthritis involving multiple sites with positive rheumatoid factor: +RF, +CCP, elevated sed rate:  She has not had any recent rheumatoid arthritis flares.  She has no joint pain or joint swelling at this time.  She has no morning stiffness and no difficulties with ADLs.   She has been taking MTX 3 tablets by mouth once weekly and folic acid 1 mg po daily for the past 2 months due to running low on MTX.  She would like a refill of MTX and folic acid. She was consented to start on Cimzia at her last visit on 12/20/17 but did not schedule a new start visit.  She does not want to start on Cimzia at this time. She would like to restart MTX 4 tablets by mouth once weekly and folic acid 1 mg po daily.  She was advised to notify us if she develops increased joint pain or joint swelling.  She will follow up in 3 months.     High risk medication use: MTX 4 tablets po once weekly and folic acid 1 mg po daily. She had lab work yesterday (10/30/18)-glucose was elevated-157. Rest of CMP WNL.  CBC WNL.  She will return for lab work in June and every 3 months to monitor for drug toxicity.   Primary osteoarthritis of both hands: She has no joint pain or joint swelling.  She has no difficulty making a complete fist, writing, or using silverware. Joint protection and muscle strengthening were discussed.  Primary osteoarthritis of both feet: She has no feet pain at this time.    Follow Up Instructions: She will follow up in 3 months.  She will return for lab work in June and every 3 months to monitor for drug toxicity.   I discussed the assessment and treatment plan with the patient. The patient was provided an opportunity to ask questions and all were answered. The patient agreed with the plan and demonstrated an understanding of the instructions.   The patient  was advised to call back or seek an in-person evaluation if the symptoms worsen or if the condition fails to improve as anticipated.  I provided 15 minutes of non-face-to-face time during this encounter.   Gearldine Bienenstock, PA-C

## 2018-10-30 NOTE — Telephone Encounter (Signed)
Patient advised we do not have results ad her labs were just drawn today. Patient advised we will call her with results tomorrow. Patient verbalized understanding.

## 2018-10-31 ENCOUNTER — Telehealth (INDEPENDENT_AMBULATORY_CARE_PROVIDER_SITE_OTHER): Payer: Medicare Other | Admitting: Rheumatology

## 2018-10-31 ENCOUNTER — Encounter: Payer: Self-pay | Admitting: Rheumatology

## 2018-10-31 DIAGNOSIS — Z789 Other specified health status: Secondary | ICD-10-CM

## 2018-10-31 DIAGNOSIS — M170 Bilateral primary osteoarthritis of knee: Secondary | ICD-10-CM | POA: Diagnosis not present

## 2018-10-31 DIAGNOSIS — M19072 Primary osteoarthritis, left ankle and foot: Secondary | ICD-10-CM

## 2018-10-31 DIAGNOSIS — I10 Essential (primary) hypertension: Secondary | ICD-10-CM

## 2018-10-31 DIAGNOSIS — M19041 Primary osteoarthritis, right hand: Secondary | ICD-10-CM | POA: Diagnosis not present

## 2018-10-31 DIAGNOSIS — M19071 Primary osteoarthritis, right ankle and foot: Secondary | ICD-10-CM

## 2018-10-31 DIAGNOSIS — Z8639 Personal history of other endocrine, nutritional and metabolic disease: Secondary | ICD-10-CM

## 2018-10-31 DIAGNOSIS — M19042 Primary osteoarthritis, left hand: Secondary | ICD-10-CM | POA: Diagnosis not present

## 2018-10-31 DIAGNOSIS — M0579 Rheumatoid arthritis with rheumatoid factor of multiple sites without organ or systems involvement: Secondary | ICD-10-CM

## 2018-10-31 DIAGNOSIS — E785 Hyperlipidemia, unspecified: Secondary | ICD-10-CM

## 2018-10-31 DIAGNOSIS — Z79899 Other long term (current) drug therapy: Secondary | ICD-10-CM

## 2018-10-31 MED ORDER — FOLIC ACID 1 MG PO TABS: 1.0000 mg | ORAL_TABLET | Freq: Every day | ORAL | 3 refills | Status: AC

## 2018-10-31 MED ORDER — METHOTREXATE 2.5 MG PO TABS: ORAL_TABLET | ORAL | 0 refills | Status: DC

## 2018-10-31 NOTE — Progress Notes (Signed)
Virtual Visit via Telephone Note  I connected with Brianna Stafford on 10/31/18 at  9:45 AM EDT by telephone and verified that I am speaking with the correct person using two identifiers.   I discussed the limitations, risks, security and privacy concerns of performing an evaluation and management service by telephone and the availability of in person appointments. I also discussed with the patient that there may be a patient responsible charge related to this service. The patient expressed understanding and agreed to proceed.  CC: Medication monitoring  History of Present Illness: Patient is a 68 year old female with a history of seropositive rheumatoid arthritis and osteoarthritis.  She is taking MTX 3 tablets po once weekly and folic acid 1 mg po daily.  She has been taking MTX 3 tablets weekly for the past 3 months due to running low on the prescription.  She was consented to start on Cimzia at her last visit on 12/20/17.  She did not schedule a nurse visit at that time.   She denies any joint pain or joint swelling.   She denies any morning stiffness.  She has not taken prednisone recently.  She needs refills of MTX and folic acid.     Review of Systems  Constitutional: Negative for fever and malaise/fatigue.  Eyes: Negative for photophobia, pain and redness.  Respiratory: Negative for cough, shortness of breath and wheezing.   Cardiovascular: Negative for chest pain and palpitations.  Gastrointestinal: Negative for blood in stool, constipation, diarrhea and nausea.  Genitourinary: Negative for dysuria.  Musculoskeletal: Negative for back pain, joint pain, myalgias and neck pain.  Skin: Negative for rash.  Neurological: Negative for dizziness and headaches.  Psychiatric/Behavioral: Negative for depression. The patient is nervous/anxious.  Observations/Objective: Denies morning stiffness. Denies difficulty getting up from chair.  Denies difficulty climbing steps.   Denies difficulty writing or  using silverware.  Denies nocturnal pain . Physical Exam  Constitutional: She is oriented to person, place, and time.  Neurological: She is alert and oriented to person, place, and time.  Psychiatric: Mood, memory, affect and judgment normal.     Assessment and Plan: Rheumatoid arthritis involving multiple sites with positive rheumatoid factor: +RF, +CCP, elevated sed rate:  She has not had any recent rheumatoid arthritis flares.  She has no joint pain or joint swelling at this time.  She has no morning stiffness and no difficulties with ADLs.   She has been taking MTX 3 tablets by mouth once weekly and folic acid 1 mg po daily for the past 2 months due to running low on MTX.  She would like a refill of MTX and folic acid. She was consented to start on Cimzia at her last visit on 12/20/17 but did not schedule a new start visit.  She does not want to start on Cimzia at this time. She would like to restart MTX 4 tablets by mouth once weekly and folic acid 1 mg po daily.  She was advised to notify us if she develops increased joint pain or joint swelling.  She will follow up in 3 months.     High risk medication use: MTX 4 tablets po once weekly and folic acid 1 mg po daily. She had lab work yesterday (10/30/18)-glucose was elevated-157. Rest of CMP WNL.  CBC WNL.  She will return for lab work in June and every 3 months to monitor for drug toxicity.   Primary osteoarthritis of both hands: She has no joint pain or joint  swelling.  She has no difficulty making a complete fist, writing, or using silverware. Joint protection and muscle strengthening were discussed.   Primary osteoarthritis of both feet: She has no feet pain at this time.   Follow Up Instructions: She will follow up in 3 months.  She will return for lab work in June and every 3 months to monitor for drug toxicity.   I discussed the assessment and treatment plan with the patient. The patient was provided an opportunity to ask questions  and all were answered. The patient agreed with the plan and demonstrated an understanding of the instructions.   The patient was advised to call back or seek an in-person evaluation if the symptoms worsen or if the condition fails to improve as anticipated.  I provided 15 minutes of non-face-to-face time during this encounter. Pollyann Savoy, MD Scribed by- Gearldine Bienenstock, PA-C

## 2018-11-02 ENCOUNTER — Telehealth: Payer: Self-pay | Admitting: Rheumatology

## 2018-11-02 NOTE — Telephone Encounter (Signed)
-----   Message from Ellen Henri, CMA sent at 10/31/2018  1:16 PM EDT ----- Patient had a virtual visit today with Dr. Corliss Skains. Please call to schedule 3 month follow up. Thanks!

## 2018-11-02 NOTE — Telephone Encounter (Signed)
I LMOM for patient to call, and schedule a fu appt for her 3 month rov.

## 2019-01-17 ENCOUNTER — Other Ambulatory Visit: Payer: Self-pay | Admitting: Physician Assistant

## 2019-01-17 ENCOUNTER — Telehealth: Payer: Self-pay | Admitting: Rheumatology

## 2019-01-17 NOTE — Telephone Encounter (Signed)
LMOM for patient to call and schedule follow-up appointment.   °

## 2019-01-17 NOTE — Telephone Encounter (Signed)
-----   Message from Emanuel sent at 01/17/2019  8:16 AM EDT ----- Please call patient to schedule follow up, patient is due end of June 2020. Thanks!

## 2019-01-17 NOTE — Telephone Encounter (Signed)
Last Visit: 10/31/2018 Next Visit: message sent to the front desk to schedule.  Labs: 10/30/2018 Glucose is elevated-157. Rest of CMP WNL. CBC WNL.  Okay to refill per Dr. Estanislado Pandy.

## 2019-03-14 NOTE — Progress Notes (Signed)
Office Visit Note  Patient: Brianna Stafford             Date of Birth: [birth year]           MRN: [id number]             PCP: Patient, No Pcp Per Referring: No ref. provider found Visit Date: 03/16/2019 Occupation: @GUAROCC @  Subjective:  Medication monitoring.   History of Present Illness: Brianna Stafford is a 68 y.o. female with rheumatoid arthritis.  She states she has been doing very well on methotrexate.  She has not missed any doses.  She denies any joint pain or joint swelling.  None of the other joints are painful.  Activities of Daily Living:  Patient reports morning stiffness for 0 minutes.   Patient Denies nocturnal pain.  Difficulty dressing/grooming: Denies Difficulty climbing stairs: Denies Difficulty getting out of chair: Denies Difficulty using hands for taps, buttons, cutlery, and/or writing: Denies  Review of Systems  Constitutional: Negative for fatigue.  HENT: Negative for mouth sores, mouth dryness and nose dryness.   Eyes: Positive for dryness. Negative for pain and itching.  Respiratory: Negative for shortness of breath, wheezing and difficulty breathing.   Cardiovascular: Negative for chest pain, palpitations and swelling in legs/feet.  Gastrointestinal: Negative for abdominal pain, blood in stool, constipation and diarrhea.  Endocrine: Negative for increased urination.  Genitourinary: Negative for painful urination.  Musculoskeletal: Negative for arthralgias, joint pain, joint swelling and morning stiffness.  Skin: Negative for rash and redness.  Allergic/Immunologic: Negative for susceptible to infections.  Neurological: Negative for dizziness, numbness, headaches, memory loss and weakness.  Hematological: Negative for swollen glands.  Psychiatric/Behavioral: Negative for confusion. The patient is not nervous/anxious.     PMFS History:  Patient Active Problem List   Diagnosis Date Noted  . Dyslipidemia 11/23/2016  . Primary osteoarthritis of both knees  08/23/2016  . Essential hypertension 08/23/2016  . Hypothyroidism 08/23/2016  . High risk medication use 07/20/2016  . Primary osteoarthritis of both hands 07/20/2016  . Primary osteoarthritis of both feet 07/20/2016  . Language barrier 07/20/2016  . Rheumatoid arthritis ([place]) 06/12/2014    Past Medical History:  Diagnosis Date  . Arthritis   . Hypertension     Family History  Problem Relation Age of Onset  . Diabetes Mother   . Cancer Sister    History reviewed. No pertinent surgical history. Social History   Social History Narrative  . Not on file   Immunization History  Administered Date(s) Administered  . Influenza-Unspecified 08/06/2015     Objective: Vital Signs: BP 130/84 (BP Location: Left Arm, Patient Position: Sitting, Cuff Size: Normal)   Pulse 71   Resp 12   Ht 5\' 3"  (1.6 m)   Wt 131 lb 6.4 oz (59.6 kg)   BMI 23.28 kg/m    Physical Exam Vitals signs and nursing note reviewed.  Constitutional:      Appearance: She is well-developed.  HENT:     Head: Normocephalic and atraumatic.  Eyes:     Conjunctiva/sclera: Conjunctivae normal.  Neck:     Musculoskeletal: Normal range of motion.  Cardiovascular:     Rate and Rhythm: Normal rate and regular rhythm.     Heart sounds: Normal heart sounds.  Pulmonary:     Effort: Pulmonary effort is normal.     Breath sounds: Normal breath sounds.  Abdominal:     General: Bowel sounds are normal.     Palpations: Abdomen is soft.  Lymphadenopathy:     Cervical: No cervical adenopathy.  Skin:    General: Skin is warm and dry.     Capillary Refill: Capillary refill takes less than 2 seconds.  Neurological:     Mental Status: She is alert and oriented to person, place, and time.  Psychiatric:        Behavior: Behavior normal.      Musculoskeletal Exam: C-spine thoracic and lumbar spine with good range of motion.  Shoulder joints elbow joints with good range of motion.  She has DIP and PIP thickening in her  hands consistent with osteoarthritis.  She has mild MCP thickening but no synovitis.  Hip joints, knee joints, ankles, MTPs and PIPs with good range of motion with no synovitis.  CDAI Exam: CDAI Score: 0.2  Patient Global: 1 mm; Provider Global: 1 mm Swollen: 0 ; Tender: 0  Joint Exam   No joint exam has been documented for this visit   There is currently no information documented on the homunculus. Go to the Rheumatology activity and complete the homunculus joint exam.  Investigation: No additional findings.  Imaging: No results found.  Recent Labs: Lab Results  Component Value Date   WBC 5.4 10/30/2018   HGB 14.2 10/30/2018   PLT 299 10/30/2018   NA 140 10/30/2018   K 4.5 10/30/2018   CL 105 10/30/2018   CO2 25 10/30/2018   GLUCOSE 157 (H) 10/30/2018   BUN 14 10/30/2018   CREATININE 0.83 10/30/2018   BILITOT 0.3 10/30/2018   ALKPHOS 91 08/24/2016   AST 24 10/30/2018   ALT 25 10/30/2018   PROT 7.2 10/30/2018   ALBUMIN 4.1 08/24/2016   CALCIUM 9.5 10/30/2018   GFRAA 83 10/30/2018   QFTBGOLDPLUS NEGATIVE 12/20/2017    Speciality Comments: No specialty comments available.  Procedures:  No procedures performed Allergies: Patient has no known allergies.   Assessment / Plan:     Visit Diagnoses: Rheumatoid arthritis involving multiple sites with positive rheumatoid factor (HCC) - +RF, +CCP, elevated sed rate: -Patient is clinically doing very well on low-dose methotrexate.  She has no synovitis on examination.  I offered obtaining x-rays of her bilateral hands to monitor for disease process.  She would like to wait until next visit.  High risk medication use - MTX 4 tablets po once weekly and folic acid 1 mg po daily.  Her labs are past 2.  We will check CBC and CMP today.  She has been advised to get labs every 3 months.  She also wanted a refill on methotrexate which was given.  Primary osteoarthritis of both hands -joint protection was discussed.  Primary  osteoarthritis of both knees -she is currently not having any discomfort.  Primary osteoarthritis of both feet -proper fitting shoes were discussed.  History of hypothyroidism   Dyslipidemia -dietary modifications were discussed.  Essential hypertension -her blood pressure is mildly elevated.  She states usually is not an issue.  Language barrier   Orders: Orders Placed This Encounter  Procedures  . CBC with Differential/Platelet  . COMPLETE METABOLIC PANEL WITH GFR   Meds ordered this encounter  Medications  . methotrexate (RHEUMATREX) 2.5 MG tablet    Sig: TAKE 4 TABLETS BY MOUTH ONCE WEEKLY. CAUTION:CHEMOTHERAPY. PROTECT FROM LIGHT.    Dispense:  48 tablet    Refill:  0     Follow-Up Instructions: Return in about 5 months (around 08/16/2019) for Rheumatoid arthritis, Osteoarthritis.   Pollyann Savoy, MD  Note - This record  has been created using [employer].  Chart creation errors have been sought, but may not always  have been located. Such creation errors do not reflect on  the standard of medical care.

## 2019-03-16 ENCOUNTER — Ambulatory Visit (INDEPENDENT_AMBULATORY_CARE_PROVIDER_SITE_OTHER): Payer: Medicare Other | Admitting: Rheumatology

## 2019-03-16 ENCOUNTER — Other Ambulatory Visit: Payer: Self-pay

## 2019-03-16 ENCOUNTER — Encounter: Payer: Self-pay | Admitting: Rheumatology

## 2019-03-16 VITALS — BP 130/84 | HR 71 | Resp 12 | Ht 63.0 in | Wt 131.4 lb

## 2019-03-16 DIAGNOSIS — Z8639 Personal history of other endocrine, nutritional and metabolic disease: Secondary | ICD-10-CM

## 2019-03-16 DIAGNOSIS — M0579 Rheumatoid arthritis with rheumatoid factor of multiple sites without organ or systems involvement: Secondary | ICD-10-CM

## 2019-03-16 DIAGNOSIS — Z789 Other specified health status: Secondary | ICD-10-CM

## 2019-03-16 DIAGNOSIS — Z79899 Other long term (current) drug therapy: Secondary | ICD-10-CM | POA: Diagnosis not present

## 2019-03-16 DIAGNOSIS — M19072 Primary osteoarthritis, left ankle and foot: Secondary | ICD-10-CM

## 2019-03-16 DIAGNOSIS — M19042 Primary osteoarthritis, left hand: Secondary | ICD-10-CM

## 2019-03-16 DIAGNOSIS — E785 Hyperlipidemia, unspecified: Secondary | ICD-10-CM

## 2019-03-16 DIAGNOSIS — M170 Bilateral primary osteoarthritis of knee: Secondary | ICD-10-CM

## 2019-03-16 DIAGNOSIS — M19041 Primary osteoarthritis, right hand: Secondary | ICD-10-CM

## 2019-03-16 DIAGNOSIS — I10 Essential (primary) hypertension: Secondary | ICD-10-CM

## 2019-03-16 DIAGNOSIS — M19071 Primary osteoarthritis, right ankle and foot: Secondary | ICD-10-CM

## 2019-03-16 MED ORDER — METHOTREXATE 2.5 MG PO TABS: ORAL_TABLET | ORAL | 0 refills | Status: DC

## 2019-03-16 NOTE — Patient Instructions (Signed)
Standing Labs We placed an order today for your standing lab work.    Please come back and get your standing labs in November and every 3 months   We have open lab daily Monday through Thursday from 8:30-12:30 PM and 1:30-4:30 PM and Friday from 8:30-12:30 PM and 1:30 -4:00 PM at the office of Dr. Judyth Demarais.   You may experience shorter wait times on Monday and Friday afternoons. The office is located at [address], [address], [place], [place] [address] No appointment is necessary.   Labs are drawn by [facility].  You may receive a bill from [facility] for your lab work.  If you wish to have your labs drawn at another location, please call the office 24 hours in advance to send orders.  If you have any questions regarding directions or hours of operation,  please call [phone].   Just as a reminder please drink plenty of water prior to coming for your lab work. Thanks 

## 2019-03-17 LAB — COMPLETE METABOLIC PANEL WITH GFR
AG Ratio: 1.5 (calc) (ref 1.0–2.5)
ALT: 19 U/L (ref 6–29)
AST: 18 U/L (ref 10–35)
Albumin: 4.4 g/dL (ref 3.6–5.1)
Alkaline phosphatase (APISO): 94 U/L (ref 37–153)
BUN: 19 mg/dL (ref 7–25)
CO2: 26 mmol/L (ref 20–32)
Calcium: 9.3 mg/dL (ref 8.6–10.4)
Chloride: 102 mmol/L (ref 98–110)
Creat: 0.83 mg/dL (ref 0.50–0.99)
GFR, Est African American: 83 mL/min/{1.73_m2} (ref >=60)
GFR, Est Non African American: 72 mL/min/{1.73_m2} (ref >=60)
Globulin: 2.9 g/dL (calc) (ref 1.9–3.7)
Glucose, Bld: 250 mg/dL — ABNORMAL HIGH (ref 65–99)
Potassium: 4.2 mmol/L (ref 3.5–5.3)
Sodium: 139 mmol/L (ref 135–146)
Total Bilirubin: 0.5 mg/dL (ref 0.2–1.2)
Total Protein: 7.3 g/dL (ref 6.1–8.1)

## 2019-03-17 LAB — CBC WITH DIFFERENTIAL/PLATELET
Absolute Monocytes: 368 cells/uL (ref 200–950)
Basophils Absolute: 29 cells/uL (ref 0–200)
Basophils Relative: 0.6 %
Eosinophils Absolute: 137 cells/uL (ref 15–500)
Eosinophils Relative: 2.8 %
HCT: 41 % (ref 35.0–45.0)
Hemoglobin: 13.7 g/dL (ref 11.7–15.5)
Lymphs Abs: 916 cells/uL (ref 850–3900)
MCH: 30.7 pg (ref 27.0–33.0)
MCHC: 33.4 g/dL (ref 32.0–36.0)
MCV: 91.9 fL (ref 80.0–100.0)
MPV: 10.8 fL (ref 7.5–12.5)
Monocytes Relative: 7.5 %
Neutro Abs: 3450 cells/uL (ref 1500–7800)
Neutrophils Relative %: 70.4 %
Platelets: 280 10*3/uL (ref 140–400)
RBC: 4.46 10*6/uL (ref 3.80–5.10)
RDW: 13 % (ref 11.0–15.0)
Total Lymphocyte: 18.7 %
WBC: 4.9 10*3/uL (ref 3.8–10.8)

## 2019-03-19 NOTE — Progress Notes (Signed)
Glucose is elevated.  Please notify patient and forward the labs to her PCP.

## 2019-06-07 ENCOUNTER — Other Ambulatory Visit: Payer: Self-pay | Admitting: Rheumatology

## 2019-06-07 NOTE — Telephone Encounter (Signed)
Last Visit: 03/16/19 Next Visit: 08/24/19 Labs: 03/16/19 Glucose is elevated.   Patent advised she is due to update labs this month. Patient reminded of labs hours.   Okay to refill per Dr. Estanislado Pandy

## 2019-06-21 ENCOUNTER — Other Ambulatory Visit: Payer: Self-pay | Admitting: Rheumatology

## 2019-08-20 NOTE — Progress Notes (Signed)
Virtual Visit via Telephone Note  I connected with Brianna Stafford on 08/24/19 at  9:30 AM EST by telephone enabled telemedicine application and verified that I am speaking with the correct person using two identifiers.  Location: Patient: Home  Provider: Clinic  This service was conducted via virtual visit.   The patient was located at home. I was located in my office.  Consent was obtained prior to the virtual visit and is aware of possible charges through their insurance for this visit.  The patient is an established patient.  Dr. Estanislado Pandy, MD conducted the virtual visit and Hazel Sams, PA-C acted as scribe during the service.  Office staff helped with scheduling follow up visits after the service was conducted.     I discussed the limitations of evaluation and management by telemedicine and the availability of in person appointments. The patient expressed understanding and agreed to proceed.  CC: Medication monitoring  History of Present Illness: Brianna Stafford is a 69 y.o. female with rheumatoid arthritis and osteoarthritis.  She is taking methotrexate 4 tablet by mouth once weekly and folic acid 1 mg po daily. She has not missed any doses recently.  She denies any recent rheumatoid arthritis flares.  She has occasional discomfort in both hands but no joint swelling. She denies any other joint pain or joint swelling at this time.  She has no morning stiffness or nocturnal pain.    Review of Systems  Constitutional: Negative for fever and malaise/fatigue.  HENT:       +Dry mouth  Eyes: Negative for photophobia, pain, discharge and redness.       +Dry eyes  Respiratory: Negative for cough, shortness of breath and wheezing.   Cardiovascular: Negative for chest pain and palpitations.  Gastrointestinal: Negative for blood in stool, constipation and diarrhea.  Genitourinary: Negative for dysuria.  Musculoskeletal: Positive for joint pain. Negative for back pain, myalgias and neck pain.  Skin:  Negative for rash.  Neurological: Negative for dizziness and headaches.  Psychiatric/Behavioral: Negative for depression. The patient is not nervous/anxious and does not have insomnia.      Observations/Objective:  Physical Exam  Constitutional: She is oriented to person, place, and time and well-developed, well-nourished, and in no distress.  HENT:  Head: Normocephalic and atraumatic.  Eyes: Conjunctivae are normal.  Pulmonary/Chest: Effort normal.  Neurological: She is alert and oriented to person, place, and time.  Psychiatric: Mood, memory, affect and judgment normal.     Patient reports morning stiffness for 0  minutes.   Patient denies nocturnal pain.  Difficulty dressing/grooming: Denies Difficulty climbing stairs: Denies Difficulty getting out of chair: Denies Difficulty using hands for taps, buttons, cutlery, and/or writing: Denies  Assessment and Plan: Visit Diagnoses: Rheumatoid arthritis involving multiple sites with positive rheumatoid factor (HCC) - +RF, +CCP, elevated sed rate: She has not had any recent rheumatoid arthritis flares.  She has intermittent discomfort in both hands but no inflammation.  She is not having any other joint pain or joint swelling. She has no morning stiffness, nocturnal pain, or difficulty with ADLs. She is clinically doing well on Methotrexate 4 tablet by mouth once weekly and folic acid 1 mg po daily. She is overdue to update lab work and will require a refill after labs have resulted.  She was advised to notify us if she develops increased joint pain or joint swelling.  She will follow up in 3 months.   High risk medication use - MTX 4 tablets po once  weekly and folic acid 1 mg po daily.  CBC and CMP were drawn on 03/16/19.  She is overdue to update lab work.  Standing orders are in place. She is planning on updating lab work next week.    Primary osteoarthritis of both hands: She has occasional pain in both hands but no inflammation.    Primary osteoarthritis of both knees: She is not having any knee joint pain or inflammation at this time.  Primary osteoarthritis of both feet: She denies any feet pain or inflammation currently.   Other medical conditions are listed as follows:   History of hypothyroidism   Dyslipidemia  Essential hypertension   Language barrier   Follow Up Instructions: She will follow up in    I discussed the assessment and treatment plan with the patient. The patient was provided an opportunity to ask questions and all were answered. The patient agreed with the plan and demonstrated an understanding of the instructions.   The patient was advised to call back or seek an in-person evaluation if the symptoms worsen or if the condition fails to improve as anticipated.  I provided 15 minutes of non-face-to-face time during this encounter.  Pollyann Savoy, MD   Scribed by-  Sherron Ales, PA-C

## 2019-08-24 ENCOUNTER — Encounter: Payer: Self-pay | Admitting: Rheumatology

## 2019-08-24 ENCOUNTER — Other Ambulatory Visit: Payer: Self-pay

## 2019-08-24 ENCOUNTER — Telehealth (INDEPENDENT_AMBULATORY_CARE_PROVIDER_SITE_OTHER): Payer: Medicare Other | Admitting: Rheumatology

## 2019-08-24 DIAGNOSIS — Z789 Other specified health status: Secondary | ICD-10-CM

## 2019-08-24 DIAGNOSIS — M170 Bilateral primary osteoarthritis of knee: Secondary | ICD-10-CM | POA: Diagnosis not present

## 2019-08-24 DIAGNOSIS — Z79899 Other long term (current) drug therapy: Secondary | ICD-10-CM | POA: Diagnosis not present

## 2019-08-24 DIAGNOSIS — E785 Hyperlipidemia, unspecified: Secondary | ICD-10-CM

## 2019-08-24 DIAGNOSIS — M0579 Rheumatoid arthritis with rheumatoid factor of multiple sites without organ or systems involvement: Secondary | ICD-10-CM | POA: Diagnosis not present

## 2019-08-24 DIAGNOSIS — M19041 Primary osteoarthritis, right hand: Secondary | ICD-10-CM | POA: Diagnosis not present

## 2019-08-24 DIAGNOSIS — I10 Essential (primary) hypertension: Secondary | ICD-10-CM

## 2019-08-24 DIAGNOSIS — M19071 Primary osteoarthritis, right ankle and foot: Secondary | ICD-10-CM

## 2019-08-24 DIAGNOSIS — Z8639 Personal history of other endocrine, nutritional and metabolic disease: Secondary | ICD-10-CM

## 2019-08-24 DIAGNOSIS — M19042 Primary osteoarthritis, left hand: Secondary | ICD-10-CM

## 2019-08-24 DIAGNOSIS — M19072 Primary osteoarthritis, left ankle and foot: Secondary | ICD-10-CM

## 2019-08-28 ENCOUNTER — Other Ambulatory Visit: Payer: Self-pay

## 2019-08-28 DIAGNOSIS — Z79899 Other long term (current) drug therapy: Secondary | ICD-10-CM

## 2019-08-29 ENCOUNTER — Telehealth: Payer: Self-pay | Admitting: Rheumatology

## 2019-08-29 LAB — COMPLETE METABOLIC PANEL WITH GFR
AG Ratio: 2 (calc) (ref 1.0–2.5)
ALT: 16 U/L (ref 6–29)
AST: 18 U/L (ref 10–35)
Albumin: 4.6 g/dL (ref 3.6–5.1)
Alkaline phosphatase (APISO): 88 U/L (ref 37–153)
BUN: 16 mg/dL (ref 7–25)
CO2: 27 mmol/L (ref 20–32)
Calcium: 9.5 mg/dL (ref 8.6–10.4)
Chloride: 104 mmol/L (ref 98–110)
Creat: 0.77 mg/dL (ref 0.60–0.93)
GFR, Est African American: 91 mL/min/{1.73_m2} (ref >=60)
GFR, Est Non African American: 78 mL/min/{1.73_m2} (ref >=60)
Globulin: 2.3 g/dL (calc) (ref 1.9–3.7)
Glucose, Bld: 121 mg/dL — ABNORMAL HIGH (ref 65–99)
Potassium: 4.6 mmol/L (ref 3.5–5.3)
Sodium: 141 mmol/L (ref 135–146)
Total Bilirubin: 0.6 mg/dL (ref 0.2–1.2)
Total Protein: 6.9 g/dL (ref 6.1–8.1)

## 2019-08-29 LAB — CBC WITH DIFFERENTIAL/PLATELET
Absolute Monocytes: 417 cells/uL (ref 200–950)
Basophils Absolute: 39 cells/uL (ref 0–200)
Basophils Relative: 0.8 %
Eosinophils Absolute: 118 cells/uL (ref 15–500)
Eosinophils Relative: 2.4 %
HCT: 41.9 % (ref 35.0–45.0)
Hemoglobin: 14 g/dL (ref 11.7–15.5)
Lymphs Abs: 1245 cells/uL (ref 850–3900)
MCH: 30.4 pg (ref 27.0–33.0)
MCHC: 33.4 g/dL (ref 32.0–36.0)
MCV: 90.9 fL (ref 80.0–100.0)
MPV: 10.8 fL (ref 7.5–12.5)
Monocytes Relative: 8.5 %
Neutro Abs: 3082 cells/uL (ref 1500–7800)
Neutrophils Relative %: 62.9 %
Platelets: 291 10*3/uL (ref 140–400)
RBC: 4.61 10*6/uL (ref 3.80–5.10)
RDW: 13.3 % (ref 11.0–15.0)
Total Lymphocyte: 25.4 %
WBC: 4.9 10*3/uL (ref 3.8–10.8)

## 2019-08-29 NOTE — Telephone Encounter (Signed)
-----   Message from Audrie Lia, [place] sent at 08/24/2019 12:07 PM EST ----- Regarding: 3 MONTH F/U

## 2019-08-29 NOTE — Telephone Encounter (Signed)
I LMOM for patient to call, and schedule follow up appt for around 11/21/19

## 2019-10-02 ENCOUNTER — Other Ambulatory Visit: Payer: Self-pay | Admitting: Rheumatology

## 2019-10-02 NOTE — Telephone Encounter (Signed)
Last Visit: 08/24/19 Next Visit: due April 2021 Labs: 08/28/19 Glucose was 121. Rest of CMP WNL. CBC WNL.  Okay to refill per Dr. Corliss Skains
